# Patient Record
Sex: Female | Born: 1950 | Race: White | Hispanic: No | Smoking: Never smoker
Health system: Southern US, Community
[De-identification: ages and names within clinical notes are randomized; demographics above are authoritative.]

## PROBLEM LIST (undated history)

## (undated) DIAGNOSIS — G20 Parkinson's disease: Secondary | ICD-10-CM

## (undated) DIAGNOSIS — G20A1 Parkinson's disease without dyskinesia, without mention of fluctuations: Secondary | ICD-10-CM

## (undated) DIAGNOSIS — J069 Acute upper respiratory infection, unspecified: Secondary | ICD-10-CM

## (undated) HISTORY — PX: TUBAL LIGATION: SHX77

## (undated) HISTORY — DX: Acute upper respiratory infection, unspecified: J06.9

## (undated) HISTORY — PX: OTHER SURGICAL HISTORY: SHX169

## (undated) HISTORY — PX: THYROIDECTOMY: SHX17

## (undated) HISTORY — PX: GALLBLADDER SURGERY: SHX652

---

## 2019-11-07 DIAGNOSIS — G20 Parkinson's disease: Secondary | ICD-10-CM | POA: Insufficient documentation

## 2020-08-05 DIAGNOSIS — M545 Low back pain, unspecified: Secondary | ICD-10-CM

## 2020-08-05 DIAGNOSIS — G629 Polyneuropathy, unspecified: Secondary | ICD-10-CM | POA: Insufficient documentation

## 2020-08-05 HISTORY — DX: Low back pain, unspecified: M54.50

## 2020-08-05 HISTORY — DX: Polyneuropathy, unspecified: G62.9

## 2021-01-27 DIAGNOSIS — R490 Dysphonia: Secondary | ICD-10-CM | POA: Insufficient documentation

## 2021-01-27 DIAGNOSIS — E049 Nontoxic goiter, unspecified: Secondary | ICD-10-CM | POA: Insufficient documentation

## 2021-01-27 HISTORY — DX: Nontoxic goiter, unspecified: E04.9

## 2021-01-27 HISTORY — DX: Dysphonia: R49.0

## 2021-02-10 DIAGNOSIS — R29898 Other symptoms and signs involving the musculoskeletal system: Secondary | ICD-10-CM

## 2021-02-10 HISTORY — DX: Other symptoms and signs involving the musculoskeletal system: R29.898

## 2021-02-15 HISTORY — PX: THYROIDECTOMY: SHX17

## 2021-05-02 DIAGNOSIS — G4710 Hypersomnia, unspecified: Secondary | ICD-10-CM | POA: Insufficient documentation

## 2021-05-02 DIAGNOSIS — R0602 Shortness of breath: Secondary | ICD-10-CM

## 2021-05-02 HISTORY — DX: Shortness of breath: R06.02

## 2021-05-02 HISTORY — DX: Hypersomnia, unspecified: G47.10

## 2021-05-07 ENCOUNTER — Encounter (HOSPITAL_COMMUNITY): Payer: Self-pay | Admitting: Emergency Medicine

## 2021-05-07 ENCOUNTER — Other Ambulatory Visit: Payer: Self-pay

## 2021-05-07 ENCOUNTER — Emergency Department (HOSPITAL_COMMUNITY): Payer: Medicare Other

## 2021-05-07 ENCOUNTER — Emergency Department (HOSPITAL_COMMUNITY)
Admission: EM | Admit: 2021-05-07 | Discharge: 2021-05-07 | Disposition: A | Discharge status: Home / Self Care | Payer: Medicare Other | Attending: Emergency Medicine | Admitting: Emergency Medicine

## 2021-05-07 DIAGNOSIS — R0602 Shortness of breath: Secondary | ICD-10-CM | POA: Insufficient documentation

## 2021-05-07 DIAGNOSIS — G20 Parkinson's disease: Secondary | ICD-10-CM | POA: Diagnosis not present

## 2021-05-07 DIAGNOSIS — R0600 Dyspnea, unspecified: Secondary | ICD-10-CM

## 2021-05-07 HISTORY — DX: Parkinson's disease: G20

## 2021-05-07 HISTORY — DX: Parkinson's disease without dyskinesia, without mention of fluctuations: G20.A1

## 2021-05-07 LAB — BASIC METABOLIC PANEL
Anion gap: 11 (ref 5–15)
BUN: 20 mg/dL (ref 8–23)
CO2: 26 mmol/L (ref 22–32)
Calcium: 9.3 mg/dL (ref 8.9–10.3)
Chloride: 102 mmol/L (ref 98–111)
Creatinine, Ser: 0.83 mg/dL (ref 0.44–1.00)
GFR, Estimated: >60 mL/min (ref >60)
Glucose, Bld: 117 mg/dL — ABNORMAL HIGH (ref 70–99)
Potassium: 3.8 mmol/L (ref 3.5–5.1)
Sodium: 139 mmol/L (ref 135–145)

## 2021-05-07 LAB — CBC
HCT: 44.2 % (ref 36.0–46.0)
Hemoglobin: 13.6 g/dL (ref 12.0–15.0)
MCH: 30.9 pg (ref 26.0–34.0)
MCHC: 30.8 g/dL (ref 30.0–36.0)
MCV: 100.5 fL — ABNORMAL HIGH (ref 80.0–100.0)
Platelets: 243 10*3/uL (ref 150–400)
RBC: 4.4 MIL/uL (ref 3.87–5.11)
RDW: 13.2 % (ref 11.5–15.5)
WBC: 9.1 10*3/uL (ref 4.0–10.5)
nRBC: 0 % (ref 0.0–0.2)

## 2021-05-07 LAB — URINALYSIS, ROUTINE W REFLEX MICROSCOPIC
Bilirubin Urine: NEGATIVE
Glucose, UA: NEGATIVE mg/dL
Hgb urine dipstick: NEGATIVE
Ketones, ur: NEGATIVE mg/dL
Leukocytes,Ua: NEGATIVE
Nitrite: NEGATIVE
Protein, ur: NEGATIVE mg/dL
Specific Gravity, Urine: 1.008 (ref 1.005–1.030)
pH: 6 (ref 5.0–8.0)

## 2021-05-07 NOTE — ED Notes (Signed)
Pt O2 sats stayed above 90% while ambulating in hallway. Pt did report increased SOB while walking.

## 2021-05-07 NOTE — Discharge Instructions (Addendum)
Your work-up was reassuring today did not provide a clear cause of the shortness of breath.  Follow-up with her pulmonologist here or the outpatient pulmonary as planned.  Continue your home medicines

## 2021-05-07 NOTE — ED Provider Notes (Signed)
[facility] EMERGENCY DEPARTMENT Provider Note   CSN: [id number] Arrival date & time: 05/07/21  0941     History  Chief Complaint  Patient presents with   Shortness of Breath    Jennifer Fletcher is a 71 y.o. female.   Shortness of Breath Associated symptoms: no abdominal pain and no rash   Patient presents with shortness of breath.  Has had episodes since August.  Has been seen at outside hospitals without clear cause.  Negative work-up.  Plans to follow-up with pulmonary but has not seen yet.  Worse episode today.  States she was worried she was going to die.  History comes from patient and her daughter.  Has been seen by neurology.  Has had a thyroid surgery that was done in November and did not change in symptoms either better or worse.  No chest pain.  No swelling in her legs.  History of Parkinson's.  No new medication changes.  Between episodes breathing is back to normal.  Appears to come on with more walking.   Past Medical History:  Diagnosis Date   Parkinson disease ([place])     Home Medications Prior to Admission medications   Not on File      Allergies    Patient has no allergy information on record.    Review of Systems   Review of Systems  Constitutional:  Negative for appetite change.  Respiratory:  Positive for shortness of breath.   Gastrointestinal:  Negative for abdominal pain.  Genitourinary:  Negative for flank pain.  Musculoskeletal:  Negative for gait problem.  Skin:  Negative for rash.  Neurological:  Negative for tremors.  Psychiatric/Behavioral:  The patient is nervous/anxious.    Physical Exam Updated Vital Signs BP 111/86    Pulse 72    Temp (!) 97.4 F (36.3 C)    Resp (!) 21    SpO2 (!) 86%  Physical Exam Vitals and nursing note reviewed.  HENT:     Head: Normocephalic.  Cardiovascular:     Rate and Rhythm: Regular rhythm.  Pulmonary:     Breath sounds: Normal breath sounds. No wheezing, rhonchi or rales.   Musculoskeletal:     Right lower leg: No edema.     Left lower leg: No edema.  Skin:    General: Skin is warm.     Capillary Refill: Capillary refill takes less than 2 seconds.  Neurological:     Mental Status: She is alert and oriented to person, place, and time.    ED Results / Procedures / Treatments   Labs (all labs ordered are listed, but only abnormal results are displayed) Labs Reviewed  BASIC METABOLIC PANEL - Abnormal; Notable for the following components:      Result Value   Glucose, Bld 117 (*)    All other components within normal limits  CBC - Abnormal; Notable for the following components:   MCV 100.5 (*)    All other components within normal limits  URINALYSIS, ROUTINE W REFLEX MICROSCOPIC - Abnormal; Notable for the following components:   Color, Urine STRAW (*)    All other components within normal limits    EKG EKG Interpretation  Date/Time:  Saturday May 07 2021 09:52:22 EST Ventricular Rate:  69 PR Interval:  144 QRS Duration: 84 QT Interval:  396 QTC Calculation: 424 R Axis:   -5 Text Interpretation: Normal sinus rhythm Minimal voltage criteria for LVH, may be normal variant ( R in aVL ) Borderline ECG  No previous ECGs available Confirmed by Davonna Belling [phone]) on 05/07/2021 12:49:46 PM  Radiology DG Chest 2 View  Result Date: 05/07/2021 CLINICAL DATA:  Shortness of breath. EXAM: CHEST - 2 VIEW COMPARISON:  12/22/2020 FINDINGS: The heart size and mediastinal contours are within normal limits. Both lungs are clear. The visualized skeletal structures are unremarkable. IMPRESSION: No active cardiopulmonary disease. Electronically Signed   By: Kerby Moors M.D.   On: 05/07/2021 10:48    Procedures Procedures    Medications Ordered in ED Medications - No data to display  ED Course/ Medical Decision Making/ A&P                           Medical Decision Making Amount and/or Complexity of Data Reviewed External Data Reviewed:  notes. Labs: ordered. Decision-making details documented in ED Course. Radiology: ordered and independent interpretation performed. ECG/medicine tests: independent interpretation performed.  Risk Prescription drug management. Decision regarding hospitalization.   Patient presents with shortness of breath.  Is had for months now.  Has been worked up at outside hospitals by neurology ENT and PCP.  No clear cause has been found.  Has episodes with exertion at times.  When she is not having the episodes her breathing to be normal.  States no real cough.  No swelling in her legs.  Has used inhalers in the past without relief.  Chest x-ray reassuring today and independently interpreted by me.  Not hypoxic with ambulation.  Reviewed previous records from outside hospital and showed previous negative D-dimer and troponins.  This appears to be the same complaints that she has had previously. Unclear cause of dyspnea although do not feel as if she needs admission the hospital at this time.  No pneumonia.  Doubt pulm embolism doubt coronary artery disease.  Does not appear to be CHF.  Potential could be an anxiety component it is already on anxiety medicines.  Has outpatient follow-up with pulmonology but referral in town here given for our pulmonology if she would rather go with them.  Will discharge home.  Discussed with patient and her daughter.        Final Clinical Impression(s) / ED Diagnoses Final diagnoses:  Dyspnea, unspecified type    Rx / DC Orders ED Discharge Orders     None         Davonna Belling, MD 05/07/21 1408

## 2021-05-07 NOTE — ED Notes (Signed)
Patient transported to X-ray 

## 2021-05-07 NOTE — ED Triage Notes (Signed)
Pt reports SOB since September.  States she has been seen multiple times at different hospitals for same and was told it was her Parkinsons.  She has seen her neurologist and ENT and continues to have SOB.    Also reports generalized weakness over the past few days.

## 2021-05-11 NOTE — Progress Notes (Signed)
Assessment/Plan:   1.  Parkinson's disease, diagnosed 2014  -Patient currently on the extended release version of the medication.  She takes 800 mg of levodopa throughout the course of the day.  Discussed with the patient that we can return to the immediate release version of levodopa.  This is generally more effective with a higher absorption rate, but if we did that , discussed with her that she would likely have more dyskinesia.  Ultimately, she decided to trial that.  This is partly because she is having what sounds like panic attacks, and she is not sure if that is because of wearing off phenomenon.  For now, she is going to stop the carbidopa/levodopa 50/200 CR, 1 tablet that she is taking at 4 AM/8 AM/noon/4 PM  -She will start carbidopa/levodopa 25/100, 2 tablets at 4 AM (she gets out of bed at this time), and take 4-6 additional single tablets throughout the day, with the last being at 4 PM (she goes to bed at 7 PM)  -For now, she will continue pramipexole 0.5 mg 3 times per day.  I do plan to wean that in the near future because of illusions and hallucinations.  -Discussed extensively the importance of safe, cardiovascular exercise.  Discussed community resources.  -We will refer to [facility].  -She met with my LCSW today.  -Discussed amantadine.  We decided to hold on that for right now.  -I talked to the patient about the logistics associated with DBS therapy.  I talked to the patient about risks/benefits/side effects of DBS therapy.  We talked about risks which included but were not limited to infection, paralysis, intraoperative seizure, death, stroke, bleeding around the electrode.   I talked to patient about fiducial placement 1 week prior to DBS therapy.  I talked to the patient about what to expect in the operating room, including the fact that this is an awake surgery.  We talked about battery placement as well as which is done under general anesthesia, generally  approximately one week following the initial surgery.  We also talked about the fact that the patient will need to be off of medications for surgery.  The patient and family were given the opportunity to ask questions, which they did, and I answered them to the best of my ability today.  She is going to talk to her family further about this.  We did discuss work-up related to this including neurocognitive testing and levodopa challenge test.  2.  Patient was given the choice of following up here with her primary neurologist, who really has done a good job with her.  Patient is currently living with daughter in [place], so they decided to follow-up here for now.  I will plan on seeing the patient back in the next 3 months, sooner should new neurologic issues arise.  Subjective:   Jennifer Fletcher was seen today in the movement disorders clinic for neurologic consultation at the request of Nevin Bloodgood,*.  The consultation is for the evaluation of Parkinson's disease.  Pt with daughter who supplements the history.  The patient has also been seen by Dr. Buck Mam at [facility] movement disorders as well as Dr Carmelina Paddock.  Records from both of those physicians have been reviewed.  She saw Dr. Buck Mam in July, 2021.  This appears to be a one-time consult.  She noted that patient had mild parkinsonism, moderate dyskinesia of the feet.  She returned to her primary neurologist, Dr.  Chintalapudi in April, 2022.  At that point, her pramipexole was increased from 0.5 mg at bedtime to 0.5 mg 3 times per day.  Symptoms for started in 2013 with difficulty ambulating.  She was started on carbidopa levodopa at that time.  Current movement disorder medications: Carbidopa/levodopa 50/200 CR, 1 tablet 4 times per day at 4am/8am/noon/4pm Pramipexole 0.5 mg at bedtime at 4am/noon/6pm Gabapentin, 300 mg 3 times daily  Specific Symptoms:  Tremor: "a little bit" but never a prominent feature (? In  the R hand) Family hx of similar:  no first degree relatives with Parkinsons Disease.  P uncle with PD Voice: voice weaker Sleep: trouble staying asleep  Vivid Dreams:  better lately  Acting out dreams:  some screaming but not acting out with falling Wet Pillows: Yes.   Postural symptoms:  Yes.    Falls?  Last fall was 2-3 years ago but has near falls Bradykinesia symptoms: shuffling gait, slow movements, and difficulty getting out of a chair Loss of smell:  No. Loss of taste:  No. Urinary Incontinence:  No. Difficulty Swallowing:  Yes.  (Some with pills) Handwriting, micrographia: Yes.   Per daughter but pt not so sure Trouble with ADL's:  No.  Trouble buttoning clothing: unsure Depression:  No. But admits to anxiety Memory changes:  may lose some train of thought in middle of sentence; stays with daughter right now (just for last week or so) - prior to that other daughter living with her temporarily Hallucinations:  Yes.  , sees people, 5 out of 7 days, knows not real (really more illusions than hallucinations) N/V:  No. Lightheaded:  rare  Syncope: No. Diplopia:  No. Dyskinesia:  Yes.  , started around 2019 (stress makes worse)  Neuroimaging of the brain has previously been performed.  It is not available for my review today.  Prior meds:  carbidopa/levodopa 50/200; pramipexole;     ALLERGIES:   Allergies  Allergen Reactions   Codeine Nausea And Vomiting    CURRENT MEDICATIONS:  Current Meds  Medication Sig   aspirin 81 MG chewable tablet Chew by mouth.   carbidopa-levodopa (SINEMET CR) 50-200 MG tablet Take 1 tablet by mouth 4 (four) times daily.   cetirizine (ZYRTEC) 10 MG tablet Take by mouth.   clonazePAM (KLONOPIN) 0.5 MG tablet Take by mouth.   diclofenac (VOLTAREN) 75 MG EC tablet Take 75 mg by mouth 2 (two) times daily.   gabapentin (NEURONTIN) 300 MG capsule Take by mouth.   levothyroxine (SYNTHROID) 112 MCG tablet    mupirocin ointment (BACTROBAN) 2 %     pramipexole (MIRAPEX) 0.5 MG tablet Take by mouth.   sertraline (ZOLOFT) 25 MG tablet Take by mouth.   tiZANidine (ZANAFLEX) 4 MG tablet Take by mouth.     Objective:   VITALS:   Vitals:   05/13/21 0844  BP: 135/63  Pulse: 62  SpO2: 94%  Weight: 206 lb 9.6 oz (93.7 kg)  Height: 5' 7" (1.702 m)    GEN:  The patient appears stated age and is in NAD. HEENT:  Normocephalic, atraumatic.  The mucous membranes are moist. The superficial temporal arteries are without ropiness or tenderness. CV:  RRR Lungs:  CTAB Neck/HEME:  There are no carotid bruits bilaterally.  Neurological examination:  Orientation: The patient is alert and oriented x3.  Cranial nerves: There is good facial symmetry. There is facial hypomimia.  Extraocular muscles are intact. The visual fields are full to confrontational testing. The speech is fluent and  clear. Soft palate rises symmetrically and there is no tongue deviation. Hearing is intact to conversational tone. Sensation: Sensation is intact to light and pinprick throughout (facial, trunk, extremities). Vibration is intact at the bilateral big toe. There is no extinction with double simultaneous stimulation. There is no sensory dermatomal level identified. Motor: Strength is 5/5 in the bilateral upper and lower extremities.   Shoulder shrug is equal and symmetric.  There is no pronator drift. Deep tendon reflexes: Deep tendon reflexes are 1/4 at the bilateral biceps, triceps, brachioradialis, patella and achilles. Plantar responses are downgoing bilaterally.  Movement examination: Tone: There is nl tone in the bilateral upper extremities.  The tone in the lower extremities is nl.  Abnormal movements: there is mod axial and LE dyskinesia (took about 45 min ago) Coordination:  There is min decremation with RAM's, with any form of RAMS, including alternating supination and pronation of the forearm, hand opening and closing, finger taps, heel taps and toe taps  bilaterally Gait and Station: The patient pushes off to arise.  The patient's stride length is decreased but does not shuffle.  Decreased arm swing on the right. .   I have reviewed and interpreted the following labs independently   Chemistry      Component Value Date/Time   NA 139 05/07/2021 0956   K 3.8 05/07/2021 0956   CL 102 05/07/2021 0956   CO2 26 05/07/2021 0956   BUN 20 05/07/2021 0956   CREATININE 0.83 05/07/2021 0956      Component Value Date/Time   CALCIUM 9.3 05/07/2021 0956      No results found for: TSH Lab Results  Component Value Date   WBC 9.1 05/07/2021   HGB 13.6 05/07/2021   HCT 44.2 05/07/2021   MCV 100.5 (H) 05/07/2021   PLT 243 05/07/2021     Total time spent on today's visit was 60 minutes, including both face-to-face time and nonface-to-face time.  Time included that spent on review of records (prior notes available to me/labs/imaging if pertinent), discussing treatment and goals, answering patient's questions and coordinating care.  Cc:  Theodosia Blender, PA-C

## 2021-05-11 NOTE — Progress Notes (Signed)
Synopsis: Referred for episodic dyspnea by Oval Linsey, PA-C  Subjective:   PATIENT ID: Jennifer Fletcher GENDER: female DOB: 03/13/1951, MRN: [id number]  Chief Complaint  Patient presents with   Consult    Pt has been having problems with SOB, states that she will have a panic attack which will make things worse.   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 Has been worked up by OSH neurology - has been scheduled for PFT and PSG given concomitant excessive daytime sleepiness and parasomnias, ENT - referred to speech for left vocal cord paresis.  Her episodic dyspnea comes on abruptly periodically through the day. She says she gets dyspneic then anxious 3x daily. Takes a few hours to calm down. Pulse ox usually normal during the episodes. She doesn't feel associated throat tightness. She does sometimes have associated throat tightness. This has been an issue even before thyroidectomy. Has been going on maybe over last year or so. Exertion may also be a trigger for these attacks. She has no orthopnea. No BLE swelling. She has never had a blood clot. She snores. Does not have PND. She does probably have excessive daytime sleepiness.   The only inhaler she has tried has been albuterol  Otherwise pertinent review of systems is negative.  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 No breathing trouble when she worked there. Smoked very little maybe half a pack total in her lifetime. No pets. She has never lived outside of [place].   Past Medical History:  Diagnosis Date   Parkinson disease ([facility])      No family history on file.   Past Surgical History:  Procedure Laterality Date   THYROIDECTOMY      Social History   Socioeconomic History   Marital status: Divorced    Spouse name: Not on file   Number of  children: Not on file   Years of education: Not on file   Highest education level: Not on file  Occupational History   Not on file  Tobacco Use   Smoking status: Never   Smokeless tobacco: Never  Substance and Sexual Activity   Alcohol use: Not Currently   Drug use: Not Currently   Sexual activity: Not on file  Other Topics Concern   Not on file  Social History Narrative   Not on file   Social Determinants of Health   Financial Resource Strain: Not on file  Food Insecurity: Not on file  Transportation Needs: Not on file  Physical Activity: Not on file  Stress: Not on file  Social Connections: Not on file  Intimate Partner Violence: Not on file     Not on File   Outpatient Medications Prior to Visit  Medication Sig Dispense Refill   aspirin 81 MG chewable tablet Chew by mouth.     carbidopa-levodopa (SINEMET CR) 50-200 MG tablet Take 1 tablet by mouth 4 (four) times daily.     cetirizine (ZYRTEC) 10 MG tablet Take by mouth.     clonazePAM (KLONOPIN) 0.5 MG tablet Take by mouth.     diclofenac (VOLTAREN) 75 MG EC tablet Take 75 mg by mouth 2 (two) times daily.     gabapentin (NEURONTIN) 300 MG capsule Take by mouth.     levothyroxine (SYNTHROID) 112 MCG tablet  mupirocin ointment (BACTROBAN) 2 %      pramipexole (MIRAPEX) 0.5 MG tablet Take by mouth.     sertraline (ZOLOFT) 25 MG tablet Take by mouth.     tiZANidine (ZANAFLEX) 4 MG tablet Take by mouth.     No facility-administered medications prior to visit.       Objective:   Physical Exam:  General appearance: 71 y.o., female, NAD, conversant  Eyes: anicteric sclerae; PERRL, tracking appropriately HENT: NCAT; MMM Neck: Trachea midline; no lymphadenopathy, no JVD Lungs: CTAB, no crackles, no wheeze, with normal respiratory effort CV: RRR, no murmur  Abdomen: Soft, non-tender; non-distended, BS present  Extremities: No peripheral edema, warm Skin: Normal turgor and texture; no rash Psych: Appropriate  affect Neuro: Alert and oriented to person and place, no focal deficit     Vitals:   05/12/21 0945  BP: 118/70  Pulse: 64  Temp: 97.8 F (36.6 C)  TempSrc: Oral  SpO2: 97%  Weight: 208 lb 6.4 oz (94.5 kg)  Height: 5\' 7"  (1.702 m)   97% on RA BMI Readings from Last 3 Encounters:  05/12/21 32.64 kg/m   Wt Readings from Last 3 Encounters:  05/12/21 208 lb 6.4 oz (94.5 kg)     CBC    Component Value Date/Time   WBC 9.1 05/07/2021 0956   RBC 4.40 05/07/2021 0956   HGB 13.6 05/07/2021 0956   HCT 44.2 05/07/2021 0956   PLT 243 05/07/2021 0956   MCV 100.5 (H) 05/07/2021 0956   MCH 30.9 05/07/2021 0956   MCHC 30.8 05/07/2021 0956   RDW 13.2 05/07/2021 0956     Chest Imaging: CXR 05/07/21 reviewed by me unremarkable  Pulmonary Functions Testing Results: No flowsheet data found.      Assessment & Plan:   # Episodic dyspnea: Unclear etiology. Never did have asthma and resolution between episodes doesn't sound typical for asthma. Anxiety component, prior goiter, thyroidectomy makes me wonder about vocal cord issue whether VCD +/- lingering vocal cord paralysis, irritable larynx/LPR/gerd. Would be an odd way for PD-related autonomic instability to manifest and doesn't have associated heart racing or palpitations/lightheadedness. Would expect any other PD -related respiratory issue to be more of a sleep-disordered breathing problem. Angina is maybe a less likely possibility, has never had workup in this regard.  Plan: - PFT in 2-4 weeks, if unrevealing then would probably recommend cardiology referral, consideration of stress testing - PSG as already scheduled with OSH    RTC 2-4 weeks to review PFTs   05/09/21, MD [facility] 05/12/2021 9:51 AM

## 2021-05-12 ENCOUNTER — Ambulatory Visit (INDEPENDENT_AMBULATORY_CARE_PROVIDER_SITE_OTHER): Payer: Medicare Other | Admitting: Student

## 2021-05-12 ENCOUNTER — Other Ambulatory Visit: Payer: Self-pay

## 2021-05-12 ENCOUNTER — Encounter: Payer: Self-pay | Admitting: Student

## 2021-05-12 VITALS — BP 118/70 | HR 64 | Temp 97.8°F | Ht 67.0 in | Wt 208.4 lb

## 2021-05-12 DIAGNOSIS — R0600 Dyspnea, unspecified: Secondary | ICD-10-CM

## 2021-05-12 NOTE — Patient Instructions (Signed)
-   PFT (breathing test) in 2-4 weeks with clinic appointment hopefully on same day - would still get sleep study done as scheduled already

## 2021-05-13 ENCOUNTER — Ambulatory Visit (INDEPENDENT_AMBULATORY_CARE_PROVIDER_SITE_OTHER): Payer: Medicare Other | Admitting: Neurology

## 2021-05-13 ENCOUNTER — Encounter: Payer: Self-pay | Admitting: Neurology

## 2021-05-13 ENCOUNTER — Other Ambulatory Visit: Payer: Self-pay

## 2021-05-13 VITALS — BP 135/63 | HR 62 | Ht 67.0 in | Wt 206.6 lb

## 2021-05-13 DIAGNOSIS — G249 Dystonia, unspecified: Secondary | ICD-10-CM

## 2021-05-13 DIAGNOSIS — G20 Parkinson's disease: Secondary | ICD-10-CM

## 2021-05-13 DIAGNOSIS — R413 Other amnesia: Secondary | ICD-10-CM

## 2021-05-13 MED ORDER — CARBIDOPA-LEVODOPA 25-100 MG PO TABS: ORAL_TABLET | ORAL | 1 refills | Status: DC

## 2021-05-13 NOTE — Patient Instructions (Addendum)
Deep Brain Stimulation  Is it the right choice for me?   What is Deep Brain Stimulation (DBS) Surgery?  DBS is a surgical procedure used to treat symptoms of Parkinsons disease (PD). It involves the implantation of an electrode into the brain (one on each side). The area of the brain that is typically targeted in PD is the subthalamic nucleus.   How does DBS work?  PD is caused by the degeneration of brain cells in a specific part of the brain which make a chemical (neurotransmitter) called dopamine. As time goes by, more and more cells degenerate and the level of dopamine in the brain declines. As a result of this dopamine deficiency, there is a certain circuit in the brain which becomes abnormally overactive. Many symptoms of PD are due to this abnormal, overactive circuit. With DBS, high frequency electrical stimulation is used to disrupt this circuit, thereby blocking the symptoms of PD that were previously being mediated through that circuit. The three main symptoms of PD are shaking (tremor), slowness of movement (bradykinesia), and stiffness (rigidity). All of these symptoms are mediated through this small circuit. Therefore, DBS is very effective in blocking these symptoms. It is important to remember, however, that DBS does not cure PD, but rather is a very effective method of treating the symptoms of the disease.   What is actually done during the operation?  The surgical procedure involves the implantation of 2 electrodes (one on each side of the brain). The electrodes are connected to 2 wires, which are then connected to a generator- pacemaker like device (either one or two) in the chest. The generator (and wires) are placed under the skin similar to a cardiac pacemaker, thus the device itself is not visible. The implanted hardware does, however, produce a lump on the chest where the generator is placed and two small bumps on the scalp where underneath there are small plastic caps which  are screwed into the skull and secure the electrode.   What symptoms can I expect DBS to treat?  DBS treats many, but not all symptoms of PD. As mentioned above, tremor, stiffness (rigidity), and slowness of movement (bradykinesia) all respond well to DBS therapy. In addition, many patients with advanced PD have problems with what we call motor fluctuations. This refers to the wearing off of medication before the next dose associated with breakthrough of PD symptoms, and at other times the effects of excess medication, such as involuntary wiggling (dyskinesia). Because the electrical stimulation is constant, the effect is continuous. Therefore motor fluctuations can be significantly reduced. Furthermore, after DBS most patients are able to significantly reduce the amount of Parkinsons medications they were previously taking. Therefore, side effects of these medications can be significantly reduced as well, and often completely eliminated. Common anti-Parkinson medication side effects include: involuntary wiggling (dyskinesia), visual distortions and hallucinations, nausea and vomiting, and lightheadedness.   What symptoms are not treated with DBS?  Some symptoms of PD are mediated through other brain circuits. Therefore, those symptoms would not be expected to improve with DBS. These symptoms include: soft and mumbled speech (hypophonia), balance trouble, and memory deficits. Even if the DBS surgery is done perfectly, the patient will still have PD. Therefore, because DBS does not block all of the effected brain circuits, the above mentioned symptoms will likely continue to progress and worsen with time.   How functional can I expect to be following DBS surgery?  Most patients who are good candidates improve with DBS. Think  about how functional you are now, when your medicines are kicked in and working at their very best. After DBS we can often get you to that point and keep you there, without all of  the fluctuations and the medication side effects. Some patients with PD have bad tremor that does not respond well to medication. DBS can work well to control tremor even when medication cannot.   What are the risks of surgery?  Because the surgery involves introducing a foreign object into the brain, there are inherent risks that are present. First, there is a very small risk, approximately 1%, of having bleeding into the brain causing symptoms similar to that of a stroke. Secondly, there is a 5-7% chance of having an infection related to the procedure. If the device gets infected, then the treatment usually requires that the infected hardware be removed temporarily while antibiotics are given. After the infection is resolved, then the hardware needs to be re-implanted. This would not leave the patient with permanent problems, but it is easy to understand how disappointed someone might be if they have to go through the surgery again. Typical symptoms of infection include redness, swelling, or pain around the device on the skin. There is theoretically a very small chance (much less than 1%) that an infection could spread to the brain. This, of course, would be much more serious. Another small risk of brain surgery is possible seizure (2-3%). A seizure produces transient sudden loss of consciousness and generalized shaking (convulsion). This can be caused by irritation of the brain during the operation. If a seizure occurs, it is almost always at the time of operation. It may require temporarily being treated with seizure medications, but this is typically only short term.   How much trouble is it to get DBS?  Unfortunately, undergoing DBS surgery is a process involving multiple steps. Even prior to surgery, there are several steps that must be done. The surgery itself takes place in three separate parts. About a week prior to insertion of the electrodes, you will be seen in an ambulatory surgery center to put  in markers into the skull, called fiducials. This allows [place] to plan the surgery and to better localize the area in which we will operate. One week later, stage 1 of the procedure will be done in which the electrodes are implanted. Approximately one week after this, stage 2 of the surgery will be done in which the generator (battery) is inserted. Stage 1 of the surgery usually takes several hours. This is when the electrode is placed. This surgery has to be done while the patient is awake. Local anesthesia is used, so the procedure is not generally very painful, but obviously it is a little scary to be operated on while you are awake. Furthermore, patients need to be off of their anti-Parkinson medication during the operation, so that we can more easily identify the abnormal circuit in the brain. It is unpleasant being off anti-Parkinson medication, even for this short time. Approximately 6 weeks after the electrode placement, programming of the device will take place at our first in office clinic visit. This allows [place] to alter the type of stimulation and optimize the beneficial effects. This is done over several clinic visits. The second post op clinic visit is usually just a week after the first, but subsequent visits will be less frequent. Eventually you shouldnt need to be seen more than once every 4-6 months or so. Overall, you should expect several programming  visits before you see the full benefit from DBS surgery.   How long does the hardware last?  The generator runs on a battery inside the device. How long the battery lasts depends on the manufacturer of the device and whether you choose are rechargeable device or a traditional battery.  A traditional battery may last 3 to 5 years and a rechargeable device may last 15-20 years. The battery replacement operation, however, is much easier than the initial elaborate operation. It is typically done as an outpatient procedure.   Does DBS always work?  The  key to success is exact placement of the electrode. As you can imagine, the brain has many circuits which are closely packed together. If the electrode is close to being in the right position, but not quite, then there may be a partial response rather than a complete response. If this happens, we may have to turn up the stimulation to try and more completely block the circuit. If we do this, however, we may effect other adjacent circuits that we are not intending to effect, and thereby produce stimulation-related side effects such as slurred speech or facial muscle pulling. These side effects can be easily eliminated by reducing the strength of the stimulation, but then some of the Parkinson symptoms may break through.   Is DBS the right choice for you?  As you can now see, there are many things that carefully need to be considered when making this decision. DBS is not appropriate for all patients with PD. The ultimate decision is yours to make. It is our job to provide you with all the pros and cons, so that you can make the choice that is right for you.  Logistical Details: Pre-Operative Visits:  1) On-Off Testing. This visit takes place in the clinic with Dr. Carles Collet several weeks prior to surgery to help determine if you are a good DBS candidate. You will come to the clinic having NOT TAKEN your PD medications. A series of physical examination tests will be done. Then you will be given a dose of carbidopa/levodopa dissolved in ginger ale. Approximately 30 minutes later you will be re-examined with the same tests to see how you respond.   IT IS EXTREMELY IMPORTANT NOT TO TAKE YOUR PARKINSON MEDICATIONS ON THE DAY OF THIS CLINIC VISIT.   2) Neuropsychological Testing. This is standard testing in all potential candidates to help determine those patients that may be at risk for developing worsening cognition from the procedure. This is a long clinic visit (multiple hours).  3) Pre-Operative MRI. If you are  deemed to be a good surgical candidate based on the above 2 visits, you will need to have MRI imaging done. It is very important that we get high quality study. You must have someone accompany you to this visit as we may have to give you sedation in order to make sure the MRI images are of adequate quality.  What to expect regarding surgery:  1. The first step involves placement of the fiducial (reference) pins. This is done the week before surgery by Dr. Vertell Limber. You are given 4 local injections of anesthetic (numbing medication). Next, 4 pins are screwed into the skull. Following the placement of the pins, you will be transferred down to have a head CT scan. The CT is used in planning for the surgery.  2. Surgery typically takes place one week later. You will have been off all of your Parkinson medications. This can be quite uncomfortable! 3.  You will have the sense of hurry up and wait multiple times throughout the day, but it is extremely important to remain as patient as possible. It is during these times that we are busy behind the scenes doing the surgical planning. 4. In the pre-op area, you will meet with the nurses and anesthesia staff. You may have a catheter placed into the bladder. Once you are taken back to the OR suite, you will be placed in a beach chair position. You will not be under general anesthesia. We need you to be awake during certain parts to allow [place] to do important testing. The actual surgical procedure is not generally painful. It is done with local anesthetic agents. However, the procedure can take many hours, and it is expected that youll become uncomfortable. We try to minimize any sedating medications, but can give you something if needed.  5. You will have a bad haircut, but it will grow back!  6. Following the surgery, you will stay overnight in the hospital for observation.  7. The following day, you will have either an MRI brain or a CT brain to allow [place] to evaluate  the placement of the electrodes and also to make sure there were no bleeding complications. Provided there are no complications, you will be discharged home the day after surgery.   It is extremely important to remember that after having DBS surgery, you will need to notify your physicians before you have an MRI.  Most DBS electrodes ARE now MRI compatible but the DBS must be placed in a special mode before the MRI is completed for your safety.  8. About 1 week later you will return for an outpatient surgery that lasts 1-2 hours during which the generator(s) will be placed. You will go home on the same day as the surgery. You will find that you are more uncomfortable after this surgery than your first surgery. You will be given medications to help with this. The pain from this surgery usually resolves in 2 or 3 days.   Start carbidopa levodopa 25/100 IR 2 tablets in the morning and 4-6 single tablets throughout the day. Last pill at 4pm  If you begin to feel panic you may dissolve in ginger ale or chew 1/2 pill of carbidopa levodopa 25/100 IR  Physical Therapy referral has been put in for you to [facility] in [place], [place] [address] Phone: [phone] Fax: [phone]

## 2021-05-15 ENCOUNTER — Encounter: Payer: Self-pay | Admitting: Neurology

## 2021-05-16 ENCOUNTER — Encounter: Payer: Self-pay | Admitting: Neurology

## 2021-06-06 ENCOUNTER — Telehealth: Payer: Self-pay | Admitting: Neurology

## 2021-06-06 ENCOUNTER — Other Ambulatory Visit: Payer: Self-pay

## 2021-06-06 DIAGNOSIS — G20 Parkinson's disease: Secondary | ICD-10-CM

## 2021-06-06 MED ORDER — CARBIDOPA-LEVODOPA ER 50-200 MG PO TBCR: 1.0000 | EXTENDED_RELEASE_TABLET | Freq: Every day | ORAL | 0 refills | Status: DC

## 2021-06-06 NOTE — Telephone Encounter (Signed)
Called patients daughter back and left voicemail message  ?

## 2021-06-06 NOTE — Telephone Encounter (Signed)
Patients daughter called me back and let me know that patient is doing great during the day with her current dosage of carbidopa levodopa and she said it has been a god send for her mom. The problem they are having now is in the evenings. Patient is getting frozen and is depended on help from a care giver at nights. Patients daughter looking for a night time medication or change in medication to help her at night. Patient currently doing at home health care for PT and when that expires they will start the pt at an outside source

## 2021-06-06 NOTE — Telephone Encounter (Signed)
Patients daughter called and stated she had a couple questions.  She stated that Jennifer Fletcher completed a sleep study thru [facility].  She wants to know if she need to get a records request.  She also stated that Jennifer Fletcher is on Sinemet and they like it.  She wants to know if she has to stop taking it at 4 or if she can take more does after 4.

## 2021-06-06 NOTE — Telephone Encounter (Signed)
Call patients daughter she is checking to see if they have any medication carbidopa levodopa 50/200 at home she will call me back to see if I need to send in any for her

## 2021-06-13 NOTE — Progress Notes (Signed)
? ?Synopsis: Referred for episodic dyspnea by Oval Linsey, PA-C ? ?Subjective:  ? ?PATIENT ID: Jennifer Fletcher GENDER: female DOB: [birth year], MRN: [id number] ? ?Chief Complaint  ?Patient presents with  ? Follow-up  ?  PFT's done today. Her breathing is unchanged since her last visit. No new co's.   ? 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 Has been worked up by OSH neurology - has been scheduled for PFT and PSG given concomitant excessive daytime sleepiness and parasomnias, ENT - referred to speech for left vocal cord paresis. ? ?Her episodic dyspnea comes on abruptly periodically through the day. She says she gets dyspneic then anxious 3x daily. Takes a few hours to calm down. Pulse ox usually normal during the episodes. She doesn't feel associated throat tightness. She does sometimes have associated throat tightness. This has been an issue even before thyroidectomy. Has been going on maybe over last year or so. Exertion may also be a trigger for these attacks. She has no orthopnea. No BLE swelling. She has never had a blood clot. She snores. Does not have PND. She does probably have excessive daytime sleepiness.  ? ?The only inhaler she has tried has been albuterol ? ?Father had COPD, lung cancer ? 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 No breathing trouble when she worked there. Smoked very little maybe half a pack total in her lifetime. No pets. She has never lived outside of [place].  ? ?Interval HPI: ? ?Last seen 05/12/21. Had PFT 05/23/21 and PSG 2/15. ? ?Still having bouts of dyspnea and anxiety during the day. She doesn't necessarily remember having throat tightness with these spells. She has no cough. No CP.  ? ?Otherwise pertinent review of systems is negative. ? ?Past Medical History:  ?Diagnosis Date  ? Parkinson disease (HCC)   ?  ? ?Family History   ?Problem Relation Age of Onset  ? High blood pressure Mother   ? COPD Father   ? Lung cancer Father   ?  ? ?Past Surgical History:  ?Procedure Laterality Date  ? galbladder removal    ? GALLBLADDER SURGERY    ? THYROIDECTOMY    ? TUBAL LIGATION    ? ? ?Social History  ? ?Socioeconomic History  ? Marital status: Divorced  ?  Spouse name: Not on file  ? Number of children: Not on file  ? Years of education: Not on file  ? Highest education level: Not on file  ?Occupational History  ? Not on file  ?Tobacco Use  ? Smoking status: Never  ? Smokeless tobacco: Never  ?Vaping Use  ? Vaping Use: Never used  ?Substance and Sexual Activity  ? Alcohol use: Yes  ?  Comment: very rarely  ? Drug use: Not Currently  ? Sexual activity: Not on file  ?Other Topics Concern  ? Not on file  ?Social History Narrative  ? Left handed   ? Lives with family  ? One story home   ? ?Social Determinants of Health  ? ?Financial Resource Strain: Not on file  ?Food Insecurity: Not on file  ?Transportation Needs: Not on file  ?Physical Activity: Not on file  ?Stress: Not on file  ?Social Connections: Not on file  ?Intimate Partner Violence: Not on file  ?  ? ?Allergies  ?Allergen Reactions  ? Codeine Nausea And Vomiting  ?  ? ?  Outpatient Medications Prior to Visit  ?Medication Sig Dispense Refill  ? aspirin 81 MG chewable tablet Chew by mouth.    ? carbidopa-levodopa (SINEMET CR) 50-200 MG tablet Take 1 tablet by mouth at bedtime. 90 tablet 0  ? carbidopa-levodopa (SINEMET IR) 25-100 MG tablet Take 2 tablets in AM 4-6 single tablets throughout the day with last pill no later than 4 pm 720 tablet 1  ? cetirizine (ZYRTEC) 10 MG tablet Take by mouth.    ? clonazePAM (KLONOPIN) 0.5 MG tablet Take by mouth daily.    ? diclofenac (VOLTAREN) 75 MG EC tablet Take 75 mg by mouth 2 (two) times daily.    ? gabapentin (NEURONTIN) 300 MG capsule Take by mouth 3 (three) times daily.    ? levothyroxine (SYNTHROID) 112 MCG tablet     ? mupirocin ointment  (BACTROBAN) 2 %     ? pramipexole (MIRAPEX) 0.5 MG tablet Take by mouth.    ? sertraline (ZOLOFT) 25 MG tablet Take by mouth.    ? tiZANidine (ZANAFLEX) 4 MG tablet Take by mouth.    ? ?No facility-administered medications prior to visit.  ? ? ? ? ? ?Objective:  ? ?Physical Exam: ? ?General appearance: 71 y.o., female, NAD, conversant  ?Eyes: anicteric sclerae; PERRL, tracking appropriately ?HENT: NCAT; MMM ?Neck: Trachea midline; no lymphadenopathy, no JVD ?Lungs: CTAB, no crackles, no wheeze, with normal respiratory effort ?CV: RRR, no murmur  ?Abdomen: Soft, non-tender; non-distended, BS present  ?Extremities: No peripheral edema, warm ?Skin: Normal turgor and texture; no rash ?Psych: Appropriate affect ?Neuro: Alert and oriented to person and place, no focal deficit  ? ? ? ? ?Vitals:  ? 06/15/21 1002  ?BP: 130/76  ?Pulse: 61  ?Temp: 97.8 ?F (36.6 ?C)  ?TempSrc: Oral  ?SpO2: 97%  ?Weight: 211 lb (95.7 kg)  ?Height: 5\' 7"  (1.702 m)  ? ? ?97% on RA ?BMI Readings from Last 3 Encounters:  ?06/15/21 33.05 kg/m?  ?05/13/21 32.36 kg/m?  ?05/12/21 32.64 kg/m?  ? ?Wt Readings from Last 3 Encounters:  ?06/15/21 211 lb (95.7 kg)  ?05/13/21 206 lb 9.6 oz (93.7 kg)  ?05/12/21 208 lb 6.4 oz (94.5 kg)  ? ? ? ?CBC ?   ?Component Value Date/Time  ? WBC 9.1 05/07/2021 0956  ? RBC 4.40 05/07/2021 0956  ? HGB 13.6 05/07/2021 0956  ? HCT 44.2 05/07/2021 0956  ? PLT 243 05/07/2021 0956  ? MCV 100.5 (H) 05/07/2021 0956  ? MCH 30.9 05/07/2021 0956  ? MCHC 30.8 05/07/2021 0956  ? RDW 13.2 05/07/2021 0956  ? ? ? ?Chest Imaging: ?CXR 05/07/21 reviewed by me unremarkable ? ?Pulmonary Functions Testing Results: ?PFT Results Latest Ref Rng & Units 06/15/2021  ?FVC-Pre L 2.95  ?FVC-Predicted Pre % 87  ?FVC-Post L 3.05  ?FVC-Predicted Post % 90  ?Pre FEV1/FVC % % 80  ?Post FEV1/FCV % % 82  ?FEV1-Pre L 2.37  ?FEV1-Predicted Pre % 92  ?FEV1-Post L 2.51  ?DLCO uncorrected ml/min/mmHg 21.91  ?DLCO UNC% % 101  ?DLCO corrected ml/min/mmHg 21.91  ?DLCO  COR %Predicted % 101  ?DLVA Predicted % 121  ?TLC L 4.88  ?TLC % Predicted % 88  ?RV % Predicted % 72  ?Reviewed by me normal ? ?PSG 06/02/21: ?CLINICAL INTERPRETATION: During the study night patient fell asleep in  ?about 4.6 minutes. This polysomnogram demonstrates minimal sleep  ?disordered breathing, which falls substantially short of the definition of  ?obstructive sleep apnea.  There was no evidence of significant respiratory  ?  events, arousals, desats or periodic limb movement. Mild sleep  ?fragmentation was observed during the study night.  Minimum O2 levels  ?dropped to as low as 90%.  Mild snoring was noticed during most part of  ?the study.  ?   ?   ?Assessment & Plan:  ? ?# Episodic dyspnea: ?Unclear etiology - could just be due to anxiety. Never did have asthma and resolution between episodes doesn't sound typical for asthma, PFTs today normal. Anxiety component, prior goiter, thyroidectomy makes me wonder about vocal cord issue whether VCD +/- lingering vocal cord paralysis, irritable larynx/LPR/gerd - PFTs today however did not show any significant FV loop abnormality and none of these issues have been documented on prior FNL exams with ENT. Would be an odd way for PD-related autonomic instability to manifest and doesn't have associated heart racing or palpitations/lightheadedness. Would expect any other PD -related respiratory issue to be more of a sleep-disordered breathing problem but PSG normal. Angina is maybe a less likely possibility, has never had workup in this regard. ? ?Plan: ?- cardiology referral placed today for consideration of stress testing ? ? ?RTC 4 months ? ? ?Omar Person, MD ? Pulmonary Critical Care ?06/15/2021 10:13 AM  ? ?

## 2021-06-15 ENCOUNTER — Encounter: Payer: Self-pay | Admitting: Student

## 2021-06-15 ENCOUNTER — Ambulatory Visit (INDEPENDENT_AMBULATORY_CARE_PROVIDER_SITE_OTHER): Payer: Medicare Other | Admitting: Student

## 2021-06-15 ENCOUNTER — Other Ambulatory Visit: Payer: Self-pay

## 2021-06-15 ENCOUNTER — Telehealth: Payer: Self-pay | Admitting: Neurology

## 2021-06-15 VITALS — BP 130/76 | HR 61 | Temp 97.8°F | Ht 67.0 in | Wt 211.0 lb

## 2021-06-15 DIAGNOSIS — R0600 Dyspnea, unspecified: Secondary | ICD-10-CM | POA: Diagnosis not present

## 2021-06-15 DIAGNOSIS — G20C Parkinsonism, unspecified: Secondary | ICD-10-CM

## 2021-06-15 DIAGNOSIS — G20 Parkinson's disease: Secondary | ICD-10-CM

## 2021-06-15 LAB — PULMONARY FUNCTION TEST
DL/VA % pred: 121 %
DL/VA: 4.91 ml/min/mmHg/L
DLCO cor % pred: 101 %
DLCO cor: 21.91 ml/min/mmHg
DLCO unc % pred: 101 %
DLCO unc: 21.91 ml/min/mmHg
FEF 25-75 Post: 3 L/sec
FEF 25-75 Pre: 2.45 L/sec
FEF2575-%Change-Post: 22 %
FEF2575-%Pred-Post: 144 %
FEF2575-%Pred-Pre: 118 %
FEV1-%Change-Post: 5 %
FEV1-%Pred-Post: 97 %
FEV1-%Pred-Pre: 92 %
FEV1-Post: 2.51 L
FEV1-Pre: 2.37 L
FEV1FVC-%Change-Post: 2 %
FEV1FVC-%Pred-Pre: 105 %
FEV6-%Change-Post: 3 %
FEV6-%Pred-Post: 94 %
FEV6-%Pred-Pre: 91 %
FEV6-Post: 3.05 L
FEV6-Pre: 2.95 L
FEV6FVC-%Pred-Post: 104 %
FEV6FVC-%Pred-Pre: 104 %
FVC-%Change-Post: 3 %
FVC-%Pred-Post: 90 %
FVC-%Pred-Pre: 87 %
FVC-Post: 3.05 L
FVC-Pre: 2.95 L
Post FEV1/FVC ratio: 82 %
Post FEV6/FVC ratio: 100 %
Pre FEV1/FVC ratio: 80 %
Pre FEV6/FVC Ratio: 100 %
RV % pred: 72 %
RV: 1.7 L
TLC % pred: 88 %
TLC: 4.88 L

## 2021-06-15 NOTE — Progress Notes (Signed)
Full PFT performed today. °

## 2021-06-15 NOTE — Telephone Encounter (Signed)
Jennifer Fletcher would like her mother to be seen before April. Said she has declined more since seeing Tat. She fell, her memory is getting worse. She is freezing again, cant move around. Jennifer Fletcher said Tat could leave her a message in her moms mychart as well. ?

## 2021-06-15 NOTE — Patient Instructions (Addendum)
-   Referral made to cardiology today ?- Four month follow up in our clinic - let us know if you'd like to change the  appointment depending on evaluation with cardiology ?

## 2021-06-15 NOTE — Telephone Encounter (Signed)
I have sent patients daughter a My chart message as she has requested for communication ?

## 2021-06-15 NOTE — Patient Instructions (Signed)
Full PFT performed today. °

## 2021-07-25 ENCOUNTER — Encounter: Payer: Self-pay | Admitting: Cardiology

## 2021-07-25 ENCOUNTER — Ambulatory Visit (INDEPENDENT_AMBULATORY_CARE_PROVIDER_SITE_OTHER): Payer: Medicare Other | Admitting: Cardiology

## 2021-07-25 VITALS — BP 140/64 | HR 76 | Ht 67.0 in | Wt 211.8 lb

## 2021-07-25 DIAGNOSIS — E785 Hyperlipidemia, unspecified: Secondary | ICD-10-CM | POA: Diagnosis not present

## 2021-07-25 DIAGNOSIS — G20A1 Parkinson's disease without dyskinesia, without mention of fluctuations: Secondary | ICD-10-CM

## 2021-07-25 DIAGNOSIS — R0602 Shortness of breath: Secondary | ICD-10-CM | POA: Diagnosis not present

## 2021-07-25 DIAGNOSIS — G20 Parkinson's disease: Secondary | ICD-10-CM | POA: Diagnosis not present

## 2021-07-25 DIAGNOSIS — R9431 Abnormal electrocardiogram [ECG] [EKG]: Secondary | ICD-10-CM

## 2021-07-25 DIAGNOSIS — F410 Panic disorder [episodic paroxysmal anxiety] without agoraphobia: Secondary | ICD-10-CM | POA: Diagnosis not present

## 2021-07-25 NOTE — Progress Notes (Addendum)
Cardiology Consultation:    Date:  07/25/2021   ID:  [name], DOB 1950/06/25, MRN [id number]  PCP:  Oval Linsey, PA-C  Cardiologist:  Gypsy Balsam, MD   Referring MD: Omar Person, MD   Chief Complaint  Patient presents with   Shortness of Breath    Ongoing sx's since last June.    Cough    Ongoing February    History of Present Illness:    Jennifer Fletcher is a 71 y.o. female who is being seen today for the evaluation of dyspnea on exertion at the request of Omar Person, MD. past medical history significant for essential hypertension, cholesterol status unknown, panic disorder, Parkinson disease.  She was sent to [place] because of episode of abrupt shortness of breath.  She thinks she is having panic attacks when it happens it also/associated with some pounding in the chest as well as some uneasy sensation in the chest but she does not have any typical tightness squeezing pressure burning chest pain when she exercises does some activity.  She does have Parkinson disease for 12 years already.  Look like bradykinesia this is majority manifestation of this I do not see any shakes on my physical exam.  She is on Sinemet already.  She was told to have panic attacks and she was giving some Zoloft as well as clonazepam to help with this.  She never smoked she never had a heart attack she never had a heart problem.  I do see his 2 troponins in the computer which both negative.  She is not on any special diet.  Past Medical History:  Diagnosis Date   Back pain, lumbosacral 08/05/2020   Last Assessment & Plan:  Formatting of this note might be different from the original. Chronic stable EDX results negative neuropathy/radiculopathy Continue GABAPENTIN   Dysphonia 01/27/2021   Formatting of this note might be different from the original. Added automatically from request for surgery [id number]   Goiter 01/27/2021   Formatting of this note might be different from the original.  Added automatically from request for surgery [id number]  Formatting of this note might be different from the original. Added automatically from request for surgery [id number]   Hypersomnia 05/02/2021   Last Assessment & Plan:  Formatting of this note might be different from the original. Undiagnosed new problem with uncertain prognosis Epworth sleepiness score:Total score: 12  Associated with snoring/Abn movements in sleep with dreams Will schedule PSG to evaluate for OSA and PArasomnias   Parkinson disease (HCC)    Polyneuropathy 08/05/2020   Last Assessment & Plan:  Formatting of this note might be different from the original. Chronicprogressing Will fup with EDX results Continue GABAPENTIN   Shortness of breath 05/02/2021   Last Assessment & Plan:  Formatting of this note might be different from the original. Chronic intermittent Already work up in ER Will check  PFT ?sec to anxiety/panic attacks If work up is normal recommend a trial with xanax   Weakness of both lower extremities 02/10/2021   Last Assessment & Plan:  Formatting of this note might be different from the original. Undiagnosed new problem with uncertain prognosis Concerned about fall risk and worsening of back pain and preserved DTR with neruopathy Will schedule MRI LS spine WO contrast to r/o cord compresion Will schedule for neuropathy/radiculopath/myopathy Counseled fall precautions    Past Surgical History:  Procedure Laterality Date   galbladder removal     GALLBLADDER SURGERY     THYROIDECTOMY  TUBAL LIGATION      Current Medications: Current Meds  Medication Sig   aspirin 81 MG chewable tablet Chew by mouth.   carbidopa-levodopa (SINEMET CR) 50-200 MG tablet Take 1 tablet by mouth at bedtime.   cetirizine (ZYRTEC) 10 MG tablet Take by mouth.   clonazePAM (KLONOPIN) 0.5 MG tablet Take by mouth daily.   diclofenac (VOLTAREN) 75 MG EC tablet Take 75 mg by mouth 2 (two) times daily.   gabapentin (NEURONTIN) 300 MG capsule  Take by mouth 3 (three) times daily.   levothyroxine (SYNTHROID) 112 MCG tablet    pramipexole (MIRAPEX) 0.5 MG tablet Take by mouth.   sertraline (ZOLOFT) 25 MG tablet Take 25 mg by mouth at bedtime.   tiZANidine (ZANAFLEX) 4 MG tablet Take by mouth.   [DISCONTINUED] carbidopa-levodopa (SINEMET IR) 25-100 MG tablet Take 2 tablets in AM 4-6 single tablets throughout the day with last pill no later than 4 pm     Allergies:   Codeine   Social History   Socioeconomic History   Marital status: Divorced    Spouse name: Not on file   Number of children: Not on file   Years of education: Not on file   Highest education level: Not on file  Occupational History   Not on file  Tobacco Use   Smoking status: Never   Smokeless tobacco: Never  Vaping Use   Vaping Use: Never used  Substance and Sexual Activity   Alcohol use: Yes    Comment: very rarely   Drug use: Not Currently   Sexual activity: Not on file  Other Topics Concern   Not on file  Social History Narrative   Left handed    Lives with family   One story home    Social Determinants of Health   Financial Resource Strain: Not on file  Food Insecurity: Not on file  Transportation Needs: Not on file  Physical Activity: Not on file  Stress: Not on file  Social Connections: Not on file     Family History: The patient's family history includes COPD in her father; High blood pressure in her mother; Lung cancer in her father. ROS:   Please see the history of present illness.    All 14 point review of systems negative except as described per history of present illness.  EKGs/Labs/Other Studies Reviewed:    The following studies were reviewed today: I did review CT done in the hospital for PE is negative there is no mentioning of a calcification however technique was not sufficient to assess that  EKG:  EKG is  ordered today.  The ekg ordered today demonstrates normal sinus rhythm, criteria for LVH, nonspecific ST segment  changes.  Recent Labs: 05/07/2021: BUN 20; Creatinine, Ser 0.83; Hemoglobin 13.6; Platelets 243; Potassium 3.8; Sodium 139  Recent Lipid Panel No results found for: CHOL, TRIG, HDL, CHOLHDL, VLDL, LDLCALC, LDLDIRECT  Physical Exam:    VS:  BP 140/64 (BP Location: Right Arm, Patient Position: Sitting)   Pulse 76   Ht 5\' 7"  (1.702 m)   Wt 211 lb 12.8 oz (96.1 kg)   SpO2 97%   BMI 33.17 kg/m     Wt Readings from Last 3 Encounters:  07/25/21 211 lb 12.8 oz (96.1 kg)  06/15/21 211 lb (95.7 kg)  05/13/21 206 lb 9.6 oz (93.7 kg)     GEN:  Well nourished, well developed in no acute distress HEENT: Normal NECK: No JVD; No carotid bruits LYMPHATICS: No lymphadenopathy  CARDIAC: RRR, no murmurs, no rubs, no gallops RESPIRATORY:  Clear to auscultation without rales, wheezing or rhonchi  ABDOMEN: Soft, non-tender, non-distended MUSCULOSKELETAL:  No edema; No deformity  SKIN: Warm and dry NEUROLOGIC:  Alert and oriented x 3 PSYCHIATRIC:  Normal affect   ASSESSMENT:    1. Shortness of breath   2. Parkinson disease (HCC)   3. Panic disorder    PLAN:    In order of problems listed above:  Shortness of breath.  Could be related to her heart.  I think we need to rule out cardiomyopathy and I will do it by doing echocardiogram.  Also will be reasonable to make sure she does not have any coronary artery disease because of her anxiety a parkinsonian panic disorder reluctant to rush to any aggressive testing right away I will start with calcium score.  If calcium score is very low then we simply abandon this and investigation but of course if calcium score will be elevated then will consider stress test or maybe even cardiac catheterization. Parkinson's.  That being managed by internal medicine team as well as neurology. Panic disorder/anxiety.  We had a long discussion about this I think she can benefit him some psychiatric evaluation and psychotherapy.  Of course we have to rule out heart  problem first before we commit her to this diagnosis. Dyslipidemia - will do calcium score to assess need for meds   Medication Adjustments/Labs and Tests Ordered: Current medicines are reviewed at length with the patient today.  Concerns regarding medicines are outlined above.  No orders of the defined types were placed in this encounter.  No orders of the defined types were placed in this encounter.   Signed, Georgeanna Lea, MD, [facility]. 07/25/2021 2:24 PM    [facility]

## 2021-07-25 NOTE — Patient Instructions (Signed)
Medication Instructions:  ?Your physician recommends that you continue on your current medications as directed. Please refer to the Current Medication list given to you today.  ?*If you need a refill on your cardiac medications before your next appointment, please call your pharmacy* ? ? ?Lab Work: ?Your physician recommends that you return for lab- Fasting Lipid, AST, ALT  ?If you have labs (blood work) drawn today and your tests are completely normal, you will receive your results only by: ?MyChart Message (if you have MyChart) OR ?A paper copy in the mail ?If you have any lab test that is abnormal or we need to change your treatment, we will call you to review the results. ? ? ?Testing/Procedures: ?Your physician has requested that you have an echocardiogram. Echocardiography is a painless test that uses sound waves to create images of your heart. It provides your doctor with information about the size and shape of your heart and how well your heart?s chambers and valves are working. This procedure takes approximately one hour. There are no restrictions for this procedure.  ? ?We will order CT coronary calcium score. ?It will cost $99.00 and is not covered by insurance.  ?Please call to schedule.   ? ?[facility]  ?[address]  ?[place], [place] [address] ?[phone] ?           Or ?[facility] ?[address] ?[place], [place] [address] ?[phone]   ? ? ?Follow-Up: ?At [facility], you and your health needs are our priority.  As part of our continuing mission to provide you with exceptional heart care, we have created designated Provider Care Teams.  These Care Teams include your primary Cardiologist (physician) and Advanced Practice Providers (APPs -  Physician Assistants and Nurse Practitioners) who all work together to provide you with the care you need, when you need it. ? ?We recommend signing up for the patient portal called "MyChart".  Sign up information is provided  on this After Visit Summary.  MyChart is used to connect with patients for Virtual Visits (Telemedicine).  Patients are able to view lab/test results, encounter notes, upcoming appointments, etc.  Non-urgent messages can be sent to your provider as well.   ?To learn more about what you can do with MyChart, go to [email or web address].   ? ?Your next appointment:   ?3 month(s) ? ?The format for your next appointment:   ?In Person ? ?Provider:   ?Gypsy Balsam, MD  ? ? ?Other Instructions ?None ? ?Important Information About Sugar ? ? ? ? ? ? ?

## 2021-07-30 DIAGNOSIS — G4752 REM sleep behavior disorder: Secondary | ICD-10-CM | POA: Insufficient documentation

## 2021-08-02 ENCOUNTER — Ambulatory Visit (INDEPENDENT_AMBULATORY_CARE_PROVIDER_SITE_OTHER): Payer: Medicare Other

## 2021-08-02 DIAGNOSIS — R0602 Shortness of breath: Secondary | ICD-10-CM | POA: Diagnosis not present

## 2021-08-02 LAB — ECHOCARDIOGRAM COMPLETE
Area-P 1/2: 3.08 cm2
S' Lateral: 3.4 cm

## 2021-08-04 ENCOUNTER — Encounter: Payer: Self-pay | Admitting: Cardiology

## 2021-08-09 ENCOUNTER — Telehealth: Payer: Self-pay | Admitting: Cardiology

## 2021-08-09 ENCOUNTER — Encounter: Payer: Self-pay | Admitting: Cardiology

## 2021-08-09 NOTE — Telephone Encounter (Signed)
Spoke with daughter Luck Sink- Per DPR- regarding note about Calcium Score test. She stated that she would take care of scheduling this. Encouraged her to call if she has any questions or concerns.  ?

## 2021-08-09 NOTE — Progress Notes (Signed)
? ? ?Assessment/Plan:  ? ? ?1.  Parkinson's disease, diagnosed 2014 ?            -She is now prescribed carbidopa/levodopa 25/100, 2 tablets at 4 AM (she gets out of bed at this time) and told she could take an additional 4-6 single tablets throughout the day.  She reports that she is taking 2 tablets at 4 AM, 1 at 9 AM and 1 at 4 PM.  She sometimes takes an additional.  She overall is doing fairly well, despite the fact that this is really a reduction in dose (she was taking carbidopa/levodopa 50/200 CR, 1 tablet 4 times per day).  She feels better on the immediate release.  She is still fairly dyskinetic ?            -For now, she will continue pramipexole 0.5 mg 3 times per day.  I considered that previously but she is not having problems with illusions/hallucinations now ?            -Discussed amantadine.  We decided to trial low dose.  Watch for confusion/hallucinations.  She will take amantadine, 100 mg with the first 2 dosages of levodopa. ?            -I talked to the patient about the logistics associated with DBS therapy.  We talked about this last visit as well as this visit.  I talked to the patient about risks/benefits/side effects of DBS therapy.  We talked about risks which included but were not limited to infection, paralysis, intraoperative seizure, death, stroke, bleeding around the electrode.   I talked to patient about fiducial placement 1 week prior to DBS therapy.  I talked to the patient about what to expect in the operating room, including the fact that this is an awake surgery.  We talked about battery placement as well as which is done under general anesthesia, generally approximately one week following the initial surgery.  We also talked about the fact that the patient will need to be off of medications for surgery.  The patient and family were given the opportunity to ask questions, which they did, and I answered them to the best of my ability today.  She is going to talk to her family  further about this.  We did discuss work-up related to this including neurocognitive testing and levodopa challenge test. ? ?Subjective:  ? ?Jennifer Fletcher was seen today in follow up for Parkinsons disease.  My previous records were reviewed prior to todays visit as well as outside records available to me.  When I saw the patient last visit, we decided to stop the daytime carbidopa/levodopa 50/200 CR that she was taking and switch it to immediate release formulation.  We discussed with her that this could certainly increase dyskinesia, but may help her to be able to move better and hopefully would potentially be able to help what were described as anxious/panic attacks.  She was also referred for physical therapy.  She reports today that she is taking 2 in the AM and generally takes 3-4 tablets in the day.  She is more dyskinetic.  However, she thinks she overall feels better on the immediate release formulation.  She is still feeling panicky however.  She is going to pulmonary though and she is having some tests (coronary Ca score) to see if that is why she feels panicky as she feels SOB as well.   ? ?Current prescribed movement disorder medications: ?carbidopa/levodopa 25/100, 2 tablets  at 4 AM (she gets out of bed at this time), and take 4-6 additional single tablets throughout the day, with the last being at 4 PM (she goes to bed at 7 PM) ?Pramipexole 0.5 mg at bedtime at 4am/noon/6pm ?Gabapentin, 300 mg 3 times daily ? ? ?ALLERGIES:   ?Allergies  ?Allergen Reactions  ? Codeine Nausea And Vomiting  ? ? ?CURRENT MEDICATIONS:  ?Current Meds  ?Medication Sig  ? aspirin 81 MG chewable tablet Chew by mouth.  ? carbidopa-levodopa (SINEMET IR) 25-100 MG tablet Take 2 tablets by mouth. 2 tablets in the morning and 4 - 6 tablets as needed before 4 pm  ? cetirizine (ZYRTEC) 10 MG tablet Take by mouth.  ? Cholecalciferol (D3 PO) Take by mouth.  ? clonazePAM (KLONOPIN) 0.5 MG tablet Take by mouth daily.  ? diclofenac  (VOLTAREN) 75 MG EC tablet Take 75 mg by mouth 2 (two) times daily.  ? gabapentin (NEURONTIN) 300 MG capsule Take by mouth 3 (three) times daily.  ? levothyroxine (SYNTHROID) 112 MCG tablet   ? pramipexole (MIRAPEX) 0.5 MG tablet Take by mouth.  ? sertraline (ZOLOFT) 50 MG tablet Take 50 mg by mouth daily.  ? tiZANidine (ZANAFLEX) 4 MG tablet Take by mouth.  ? ? ? ?Objective:  ? ?PHYSICAL EXAMINATION:   ? ?VITALS:   ?Vitals:  ? 08/11/21 1303  ?BP: 104/62  ?Pulse: 68  ?SpO2: 96%  ?Weight: 212 lb 9.6 oz (96.4 kg)  ?Height: 5\' 7"  (1.702 m)  ? ? ?GEN:  The patient appears stated age and is in NAD. ?HEENT:  Normocephalic, atraumatic.  The mucous membranes are moist. The superficial temporal arteries are without ropiness or tenderness. ? ? ?Neurological examination: ? ?Orientation: The patient is alert and oriented x3. ?Cranial nerves: There is good facial symmetry with facial hypomimia. The speech is fluent and clear. Soft palate rises symmetrically and there is no tongue deviation. Hearing is intact to conversational tone. ?Sensation: Sensation is intact to light touch throughout ?Motor: Strength is at least antigravity x4. ? ?Movement examination: ?Tone: There is nl tone in the bilateral upper extremities.  The tone in the lower extremities is nl.  ?Abnormal movements: there is mod axial and LLE dyskinesia (took about 45 min ago) ?Coordination:  There is min decremation with RAM's, only with action of turning in light bulb on the right and finger taps on the right ?Gait and Station: The patient pushes off to arise.  The patient's stride length is normal (improved) ? ?I have reviewed and interpreted the following labs independently ? ?  Chemistry   ?   ?Component Value Date/Time  ? NA 139 05/07/2021 0956  ? K 3.8 05/07/2021 0956  ? CL 102 05/07/2021 0956  ? CO2 26 05/07/2021 0956  ? BUN 20 05/07/2021 0956  ? CREATININE 0.83 05/07/2021 0956  ?    ?Component Value Date/Time  ? CALCIUM 9.3 05/07/2021 0956  ?  ? ? ? ?Lab  Results  ?Component Value Date  ? WBC 9.1 05/07/2021  ? HGB 13.6 05/07/2021  ? HCT 44.2 05/07/2021  ? MCV 100.5 (H) 05/07/2021  ? PLT 243 05/07/2021  ? ? ?No results found for: TSH ? ? ?Total time spent on today's visit was 30 minutes, including both face-to-face time and nonface-to-face time.  Time included that spent on review of records (prior notes available to me/labs/imaging if pertinent), discussing treatment and goals, answering patient's questions and coordinating care. ? ?Cc:  05/09/2021, PA-C ? ?

## 2021-08-09 NOTE — Telephone Encounter (Signed)
Pt's neighbor states that she was told to call back with name, address and fax number of location for pt to have CT Coronary Calcium Score done. ? ?[facility] [employer]) ? Fax - [phone] ?Attn: Centralized Scheduling ? ?Please advise ?

## 2021-08-09 NOTE — Telephone Encounter (Signed)
Error

## 2021-08-10 ENCOUNTER — Other Ambulatory Visit: Payer: Self-pay

## 2021-08-10 DIAGNOSIS — R29898 Other symptoms and signs involving the musculoskeletal system: Secondary | ICD-10-CM

## 2021-08-10 DIAGNOSIS — R0602 Shortness of breath: Secondary | ICD-10-CM

## 2021-08-10 DIAGNOSIS — F410 Panic disorder [episodic paroxysmal anxiety] without agoraphobia: Secondary | ICD-10-CM

## 2021-08-10 DIAGNOSIS — E785 Hyperlipidemia, unspecified: Secondary | ICD-10-CM

## 2021-08-10 NOTE — Addendum Note (Signed)
Addended by: Jacobo Forest D on: 08/10/2021 01:09 PM ? ? Modules accepted: Orders ? ?

## 2021-08-10 NOTE — Telephone Encounter (Signed)
Called patient's daughter per DPR to find out more information. Calcium score test was not ordered. Attempted to order the calcium score test per Dr. Agustin Cree. Unable to order the test because none of the diagnosis for the patient will allow me to order the test. Sent Dr. Agustin Cree a staff message regarding this so he can enter a diagnosis to cover the calcium score test so I can order it. ?

## 2021-08-10 NOTE — Telephone Encounter (Signed)
? ?  Pt trying to call to schedule calcium score but there's not order on file. She also wants to complaint the CT lab [facility] office when she called to schedule her appt she said they are rude to her ?

## 2021-08-10 NOTE — Telephone Encounter (Signed)
Called patient's daughter and informed her not to call and try to schedule the calcium score test until we reach out to her to let her know that the order is in Epic. She was appreciative of the call and had no further questions. ?

## 2021-08-11 ENCOUNTER — Ambulatory Visit (INDEPENDENT_AMBULATORY_CARE_PROVIDER_SITE_OTHER): Payer: Medicare Other | Admitting: Neurology

## 2021-08-11 ENCOUNTER — Encounter: Payer: Self-pay | Admitting: Neurology

## 2021-08-11 VITALS — BP 104/62 | HR 68 | Ht 67.0 in | Wt 212.6 lb

## 2021-08-11 DIAGNOSIS — G20 Parkinson's disease: Secondary | ICD-10-CM

## 2021-08-11 DIAGNOSIS — G249 Dystonia, unspecified: Secondary | ICD-10-CM | POA: Diagnosis not present

## 2021-08-11 MED ORDER — AMANTADINE HCL 100 MG PO CAPS: 100.0000 mg | ORAL_CAPSULE | Freq: Two times a day (BID) | ORAL | 1 refills | Status: DC

## 2021-08-11 NOTE — Patient Instructions (Signed)
Start amantadine 100 mg, 1 capsule twice per day ?

## 2021-08-13 LAB — LIPID PANEL
Chol/HDL Ratio: 5.1 ratio — ABNORMAL HIGH (ref 0.0–4.4)
Cholesterol, Total: 241 mg/dL — ABNORMAL HIGH (ref 100–199)
HDL: 47 mg/dL (ref >39)
LDL Chol Calc (NIH): 162 mg/dL — ABNORMAL HIGH (ref 0–99)
Triglycerides: 176 mg/dL — ABNORMAL HIGH (ref 0–149)
VLDL Cholesterol Cal: 32 mg/dL (ref 5–40)

## 2021-08-13 LAB — ALT: ALT: 6 [IU]/L (ref 0–32)

## 2021-08-13 LAB — AST: AST: 13 IU/L (ref 0–40)

## 2021-08-18 ENCOUNTER — Telehealth: Payer: Self-pay

## 2021-08-18 DIAGNOSIS — E785 Hyperlipidemia, unspecified: Secondary | ICD-10-CM

## 2021-08-18 MED ORDER — ATORVASTATIN CALCIUM 10 MG PO TABS: 10.0000 mg | ORAL_TABLET | Freq: Every day | ORAL | 2 refills | Status: AC

## 2021-08-18 NOTE — Telephone Encounter (Signed)
-----   Message from Georgeanna Lea, MD sent at 08/17/2021  9:22 PM EDT ----- ?Cholesterol very poor she need to start cholesterol medication I would suggest Lipitor 10 mg daily ?

## 2021-08-18 NOTE — Telephone Encounter (Signed)
Spoke with [place] Sink, notified of results and recommendations and agreed with plan. Rx sent  ?

## 2021-08-23 NOTE — Telephone Encounter (Signed)
Jennifer Lea, MD  You 3 hours ago (1:09 PM)  ? ?yes   ? ? You  Jennifer Lea, MD 5 days ago  ? ?Do you want this patient to repeat blood work in 6 weeks to recheck cholesterol?   ? ?LM to return my call. Lab order on file ?

## 2021-08-23 NOTE — Addendum Note (Signed)
Addended by: Heywood Bene on: 08/23/2021 05:01 PM ? ? Modules accepted: Orders ? ?

## 2021-08-29 NOTE — Telephone Encounter (Signed)
LM to return my call. 

## 2021-08-30 NOTE — Telephone Encounter (Signed)
Spoke with [place] Sink advised to have the patient come by fasting for BW in 6 weeks. Order on file ?

## 2021-09-22 ENCOUNTER — Encounter: Payer: Self-pay | Admitting: Cardiology

## 2021-09-22 ENCOUNTER — Ambulatory Visit
Admission: RE | Admit: 2021-09-22 | Discharge: 2021-09-22 | Disposition: A | Discharge status: Home / Self Care | Payer: Self-pay | Source: Ambulatory Visit | Attending: Cardiology | Admitting: Cardiology

## 2021-09-22 DIAGNOSIS — R9431 Abnormal electrocardiogram [ECG] [EKG]: Secondary | ICD-10-CM

## 2021-09-22 DIAGNOSIS — E785 Hyperlipidemia, unspecified: Secondary | ICD-10-CM

## 2021-10-28 ENCOUNTER — Other Ambulatory Visit: Payer: Self-pay | Admitting: Neurology

## 2021-11-10 ENCOUNTER — Ambulatory Visit: Payer: Medicare Other | Admitting: Cardiology

## 2021-11-22 ENCOUNTER — Ambulatory Visit (INDEPENDENT_AMBULATORY_CARE_PROVIDER_SITE_OTHER): Payer: Medicare Other | Admitting: Allergy

## 2021-11-22 ENCOUNTER — Encounter: Payer: Self-pay | Admitting: Allergy

## 2021-11-22 ENCOUNTER — Other Ambulatory Visit: Payer: Self-pay

## 2021-11-22 VITALS — BP 130/78 | HR 78 | Resp 16 | Ht 66.0 in | Wt 225.6 lb

## 2021-11-22 DIAGNOSIS — J4540 Moderate persistent asthma, uncomplicated: Secondary | ICD-10-CM | POA: Diagnosis not present

## 2021-11-22 DIAGNOSIS — R0602 Shortness of breath: Secondary | ICD-10-CM | POA: Diagnosis not present

## 2021-11-22 DIAGNOSIS — H1013 Acute atopic conjunctivitis, bilateral: Secondary | ICD-10-CM

## 2021-11-22 DIAGNOSIS — H109 Unspecified conjunctivitis: Secondary | ICD-10-CM

## 2021-11-22 DIAGNOSIS — J310 Chronic rhinitis: Secondary | ICD-10-CM

## 2021-11-22 MED ORDER — FLUTICASONE PROPIONATE 50 MCG/ACT NA SUSP: NASAL | 5 refills | Status: AC

## 2021-11-22 MED ORDER — ALBUTEROL SULFATE HFA 108 (90 BASE) MCG/ACT IN AERS: 2.0000 | INHALATION_SPRAY | Freq: Four times a day (QID) | RESPIRATORY_TRACT | 1 refills | Status: AC | PRN

## 2021-11-22 MED ORDER — FEXOFENADINE HCL 180 MG PO TABS: 180.0000 mg | ORAL_TABLET | Freq: Every day | ORAL | 5 refills | Status: AC

## 2021-11-22 MED ORDER — AZELASTINE HCL 0.15 % NA SOLN: NASAL | 5 refills | Status: AC

## 2021-11-22 MED ORDER — MONTELUKAST SODIUM 10 MG PO TABS: 10.0000 mg | ORAL_TABLET | Freq: Every day | ORAL | 5 refills | Status: DC

## 2021-11-22 MED ORDER — ALBUTEROL SULFATE (2.5 MG/3ML) 0.083% IN NEBU: 2.5000 mg | INHALATION_SOLUTION | Freq: Four times a day (QID) | RESPIRATORY_TRACT | 2 refills | Status: AC | PRN

## 2021-11-22 NOTE — Patient Instructions (Signed)
-   Testing today showed: negative  - Stop taking: Claritin, Zyrtec and Xyzal as these do not seem to be effective antihistamines.  - Continue with: Flonase (fluticasone) two sprays per nostril daily (AIM FOR EAR ON EACH SIDE) for 1-2 weeks at a time before stopping once nasal congestion improves for maximum benefit. - Start taking: Allegra (fexofenadine) 180mg  tablet once daily.  This is a long-acting antihistamine you have not tried before. Singulair 10mg  daily at bedtime.  This is an allergy and reactive airway medication to help with shortness of breath.  If you notice any change in mood/behavior/sleep after starting Singulair then stop this medication and let know.  Symptoms resolve after stopping the medication.   Azelastine nasal spray 2 sprays each nostril twice a day as needed for nasal drainage or throat clearing.   - You can use an extra dose of the antihistamine, if needed, for breakthrough symptoms.  - Consider nasal saline rinses 1-2 times daily to remove allergens from the nasal cavities as well as help with mucous clearance (this is especially helpful to do before the nasal sprays are given)  - Neb and teaching provided. - Daily controller medication(s): Singulair 10mg  daily as above - Prior to physical activity: albuterol 2 puffs 10-15 minutes before physical activity. - Rescue medications: albuterol 2 puffs via inhaler OR albuterol 1 vial via nebulizer every 4 hours as needed  - Can try breathing techniques first and if shortness of breath not improved within minutes then use albuterol - Lung function is normal today  - Breathing control goals:  * Full participation in all desired activities (may need albuterol before activity) * Albuterol use two time or less a week on average (not counting use with activity) * Cough interfering with sleep two time or less a month * Oral steroids no more than once a year * No hospitalizations  Follow-up in 3-4 months or sooner if needed

## 2021-11-22 NOTE — Progress Notes (Signed)
New Patient Note  RE: Jennifer Fletcher MRN: [id number] DOB: 05/11/1950 Date of Office Visit: 11/22/2021   Primary care provider: Oval Linsey, PA-C  Chief Complaint: allergies and shortness of breath  History of present illness: Jennifer Fletcher is a 71 y.o. female presenting today for evaluation of allergies and ?asthma.    She reports nasal congestion, runny nose, itchy/watery eyes, sneezing, throat clearing, ear itch.  These symptoms are year-round but can be worse in the spring.   She has used Zytec, Claritin and Xyzal.  Currently taking Claritin in the morning and xyzal at night.  These antihistamines all work about the same which is not doing much.  She also reports using flonase in the mornings and does help with the nasal congestion.  Has not used an eye drop. She has done nasal saline rinses before and does tolerate.    She has been short of breath and daughter feels like its been an issue over a year now.  She reports some wheezing but not very much.  She does report a lot of chest tightness.  She does cough a lot and is mostly productive.  She reports activity makes her more short of breath.  She also reports if she gets upset can cause more shortness of breath.  Denies nighttime awakenings. She states she has had 5 ED visits in the past year or so.  She has had a breathing treatment once at the ED and states did help.  She did get a nebulizer machine with albuterol about 1.5 years ago but states never got any albuterol refills thus she ran out.  However she can't find the nebulizer as she states since then she has moved; nebulizer was given to pt at no cost and not filed with insurance.  She will also perform breathing treatment as does report some anxiety/panic attacks.  She had her thyroid removed due to nodules but this did not help with her breathing.   No history of eczema or food allergy.  She reports codiene makes her sick to her stomach with vomiting.  Review of systems in  the past 4 weeks: Review of Systems  Constitutional: Negative.   HENT:  Positive for congestion, postnasal drip and sneezing.   Eyes: Negative.   Respiratory:  Positive for cough and shortness of breath.   Cardiovascular: Negative.   Gastrointestinal: Negative.   Musculoskeletal: Negative.   Skin: Negative.   Allergic/Immunologic: Negative.   Neurological: Negative.     All other systems negative unless noted above in HPI  Past medical history: Past Medical History:  Diagnosis Date   Back pain, lumbosacral 08/05/2020   Last Assessment & Plan:  Formatting of this note might be different from the original. Chronic stable EDX results negative neuropathy/radiculopathy Continue GABAPENTIN   Dysphonia 01/27/2021   Formatting of this note might be different from the original. Added automatically from request for surgery [id number]   Goiter 01/27/2021   Formatting of this note might be different from the original. Added automatically from request for surgery [id number]  Formatting of this note might be different from the original. Added automatically from request for surgery [id number]   Hypersomnia 05/02/2021   Last Assessment & Plan:  Formatting of this note might be different from the original. Undiagnosed new problem with uncertain prognosis Epworth sleepiness score:Total score: 12  Associated with snoring/Abn movements in sleep with dreams Will schedule PSG to evaluate for OSA and PArasomnias   Parkinson disease (HCC)    Polyneuropathy  08/05/2020   Last Assessment & Plan:  Formatting of this note might be different from the original. Chronicprogressing Will fup with EDX results Continue GABAPENTIN   Recurrent upper respiratory infection (URI)    Shortness of breath 05/02/2021   Last Assessment & Plan:  Formatting of this note might be different from the original. Chronic intermittent Already work up in ER Will check  PFT ?sec to anxiety/panic attacks If work up is normal recommend a trial with  xanax   Weakness of both lower extremities 02/10/2021   Last Assessment & Plan:  Formatting of this note might be different from the original. Undiagnosed new problem with uncertain prognosis Concerned about fall risk and worsening of back pain and preserved DTR with neruopathy Will schedule MRI LS spine WO contrast to r/o cord compresion Will schedule for neuropathy/radiculopath/myopathy Counseled fall precautions    Past surgical history: Past Surgical History:  Procedure Laterality Date   galbladder removal     GALLBLADDER SURGERY     THYROIDECTOMY  02/2021   TUBAL LIGATION      Family history:  Family History  Problem Relation Age of Onset   High blood pressure Mother    COPD Father    Lung cancer Father     Social history: Lives in a home without carpeting with heat pump heating and cooling.  No pets in the home.  Cats outside the home.  No concern for water damage, mildew or roaches in the home.  She is retired.  Denies smoking history   Medication List: Current Outpatient Medications  Medication Sig Dispense Refill   amantadine (SYMMETREL) 100 MG capsule Take 1 capsule (100 mg total) by mouth 2 (two) times daily. 180 capsule 1   aspirin 81 MG chewable tablet Chew by mouth.     carbidopa-levodopa (SINEMET IR) 25-100 MG tablet TAKE 2 TABLETS IN MORNING, 4- 6 TABLETS THROUGHOUT THE DAY WITH LAST PILL NO LATER THAN 4PM 720 tablet 0   cetirizine (ZYRTEC) 10 MG tablet Take by mouth.     Cholecalciferol (D3 PO) Take by mouth.     clonazePAM (KLONOPIN) 0.5 MG tablet Take by mouth daily.     gabapentin (NEURONTIN) 300 MG capsule Take by mouth 3 (three) times daily.     levothyroxine (SYNTHROID) 112 MCG tablet      pramipexole (MIRAPEX) 0.5 MG tablet Take by mouth.     sertraline (ZOLOFT) 50 MG tablet Take 50 mg by mouth daily.     tiZANidine (ZANAFLEX) 4 MG tablet Take by mouth.     atorvastatin (LIPITOR) 10 MG tablet Take 1 tablet (10 mg total) by mouth daily. 30 tablet 2    diclofenac (VOLTAREN) 75 MG EC tablet Take 75 mg by mouth 2 (two) times daily. (Patient not taking: Reported on 11/22/2021)     No current facility-administered medications for this visit.    Known medication allergies: Allergies  Allergen Reactions   Codeine Nausea And Vomiting     Physical examination: Blood pressure 130/78, pulse 78, resp. rate 16, height 5\' 6"  (1.676 m), weight 225 lb 9.6 oz (102.3 kg), SpO2 95 %.  General: Alert, interactive, in no acute distress. HEENT: PERRLA, TMs pearly gray, turbinates minimally edematous without discharge, post-pharynx non erythematous. Neck: Supple without lymphadenopathy. Lungs: Clear to auscultation without wheezing, rhonchi or rales. {no increased work of breathing. CV: Normal S1, S2 without murmurs. Abdomen: Nondistended, nontender. Skin: Warm and dry, without lesions or rashes. Extremities:  No clubbing, cyanosis or edema. Neuro:  Grossly intact.  Diagnositics/Labs: Spirometry: FEV1: 2.01L 86%, FVC: 2.55L 84%, ratio consistent with nonobstructive pattern  Allergy testing:   Airborne Adult Perc - 11/22/21 1022     Time Antigen Placed 1022    Allergen Manufacturer Greer    Location Back    Number of Test 59    1. Control-Buffer 50% Glycerol Negative    2. Control-Histamine 1 mg/ml 2+    3. Albumin saline Negative    4. Bahia Negative    5. French Southern Territories Negative    6. Johnson Negative    7. Kentucky Blue Negative    8. Meadow Fescue Negative    9. Perennial Rye Negative    10. Sweet Vernal Negative    11. Timothy Negative    12. Cocklebur Negative    13. Burweed Marshelder Negative    14. Ragweed, short Negative    15. Ragweed, Giant Negative    16. Plantain,  English Negative    17. Lamb's Quarters Negative    18. Sheep Sorrell Negative    19. Rough Pigweed Negative    20. Marsh Elder, Rough Negative    21. Mugwort, Common Negative    22. Ash mix Negative    23. Birch mix Negative    24. Beech American Negative     25. Box, Elder Negative    26. Cedar, red Negative    27. Cottonwood, Guinea-Bissau Negative    28. Elm mix Negative    29. Hickory Negative    30. Maple mix Negative    31. Oak, Guinea-[place]au mix Negative    32. Pecan Pollen Negative    33. Pine mix Negative    34. Sycamore Eastern Negative    35. Walnut, Black Pollen Negative    36. Alternaria alternata Negative    37. Cladosporium Herbarum Negative    38. Aspergillus mix Negative    39. Penicillium mix Negative    40. Bipolaris sorokiniana (Helminthosporium) Negative    41. Drechslera spicifera (Curvularia) Negative    42. Mucor plumbeus Negative    43. Fusarium moniliforme Negative    44. Aureobasidium pullulans (pullulara) Negative    45. Rhizopus oryzae Negative    46. Botrytis cinera Negative    47. Epicoccum nigrum Negative    48. Phoma betae Negative    49. Candida Albicans Negative    50. Trichophyton mentagrophytes Negative    51. Mite, D Farinae  5,000 AU/ml Negative    52. Mite, D Pteronyssinus  5,000 AU/ml Negative    53. Cat Hair 10,000 BAU/ml Negative    54.  Dog Epithelia Negative    55. Mixed Feathers Negative    56. Horse Epithelia Negative    57. Cockroach, German Negative    58. Mouse Negative    59. Tobacco Leaf Negative             Intradermal - 11/22/21 1102     Time Antigen Placed 1102    Allergen Manufacturer Greer    Location Arm    Control Negative    7 Grass Negative    Ragweed mix Negative    Weed mix Negative    Tree mix Negative    Mold 1 Negative    Mold 2 Negative    Mold 3 Negative    Mold 4 Negative    Cat Negative    Dog Negative    Cockroach Negative    Mite mix Negative  Allergy testing results were read and interpreted by provider, documented by clinical staff.   Assessment and plan: Nonallergic rhinoconjunctivitis  - Testing today showed: negative  - Stop taking: Claritin, Zyrtec and Xyzal as these do not seem to be effective antihistamines.  - Continue  with: Flonase (fluticasone) two sprays per nostril daily (AIM FOR EAR ON EACH SIDE) for 1-2 weeks at a time before stopping once nasal congestion improves for maximum benefit. - Start taking: Allegra (fexofenadine) 180mg  tablet once daily.  This is a long-acting antihistamine you have not tried before. Singulair 10mg  daily at bedtime.  This is an allergy and reactive airway medication to help with shortness of breath.  If you notice any change in mood/behavior/sleep after starting Singulair then stop this medication and let know.  Symptoms resolve after stopping the medication.   Azelastine nasal spray 2 sprays each nostril twice a day as needed for nasal drainage or throat clearing.   - You can use an extra dose of the antihistamine, if needed, for breakthrough symptoms.  - Consider nasal saline rinses 1-2 times daily to remove allergens from the nasal cavities as well as help with mucous clearance (this is especially helpful to do before the nasal sprays are given)  Shortness of breath Reactive airway - Neb and teaching provided. - Daily controller medication(s): Singulair 10mg  daily as above - Prior to physical activity: albuterol 2 puffs 10-15 minutes before physical activity. - Rescue medications: albuterol 2 puffs via inhaler OR albuterol 1 vial via nebulizer every 4 hours as needed  - Can try breathing techniques first and if shortness of breath not improved within minutes then use albuterol - Lung function is normal today  - Breathing control goals:  * Full participation in all desired activities (may need albuterol before activity) * Albuterol use two time or less a week on average (not counting use with activity) * Cough interfering with sleep two time or less a month * Oral steroids no more than once a year * No hospitalizations  Follow-up in 3-4 months or sooner if needed  I appreciate the opportunity to take part in Shamariah's care. Please do not hesitate to contact me with  questions.  Sincerely,   , MD Allergy/Immunology [facility]

## 2021-11-23 ENCOUNTER — Ambulatory Visit (INDEPENDENT_AMBULATORY_CARE_PROVIDER_SITE_OTHER): Payer: Medicare Other | Admitting: Cardiology

## 2021-11-23 ENCOUNTER — Encounter: Payer: Self-pay | Admitting: Cardiology

## 2021-11-23 VITALS — BP 140/64 | HR 76 | Ht 67.0 in | Wt 226.2 lb

## 2021-11-23 DIAGNOSIS — E785 Hyperlipidemia, unspecified: Secondary | ICD-10-CM | POA: Diagnosis not present

## 2021-11-23 DIAGNOSIS — R0602 Shortness of breath: Secondary | ICD-10-CM | POA: Diagnosis not present

## 2021-11-23 DIAGNOSIS — F410 Panic disorder [episodic paroxysmal anxiety] without agoraphobia: Secondary | ICD-10-CM | POA: Diagnosis not present

## 2021-11-23 DIAGNOSIS — G20 Parkinson's disease: Secondary | ICD-10-CM | POA: Diagnosis not present

## 2021-11-23 NOTE — Progress Notes (Signed)
Cardiology Office Note:    Date:  11/23/2021   ID:  Jennifer Fletcher, DOB 01/28/1951, MRN [id number]  PCP:  Oval Linsey, PA-C  Cardiologist:  Gypsy Balsam, MD    Referring MD: Oval Linsey, PA-C   Chief Complaint  Patient presents with   Follow-up    History of Present Illness:    Jennifer Fletcher is a 71 y.o. female with past medical history significant for Parkinson, essential hypertension dyslipidemia.  Panic disorder.  She was referred to [place] initially because of dyspnea on exertion.  She did have a echocardiogram done which showed preserved left ventricle ejection fraction.  We also evaluated for coronary artery disease she did have a calcium score done in spite of her being 70 years and presently her calcium score is 0.  There is no explanation for her shortness of breath diastolic heart condition.  She comes today to my office for follow-up overall doing well denies of any chest pain tightness squeezing pressure burning chest.  Past Medical History:  Diagnosis Date   Back pain, lumbosacral 08/05/2020   Last Assessment & Plan:  Formatting of this note might be different from the original. Chronic stable EDX results negative neuropathy/radiculopathy Continue GABAPENTIN   Dysphonia 01/27/2021   Formatting of this note might be different from the original. Added automatically from request for surgery [id number]   Goiter 01/27/2021   Formatting of this note might be different from the original. Added automatically from request for surgery [id number]  Formatting of this note might be different from the original. Added automatically from request for surgery [id number]   Hypersomnia 05/02/2021   Last Assessment & Plan:  Formatting of this note might be different from the original. Undiagnosed new problem with uncertain prognosis Epworth sleepiness score:Total score: 12  Associated with snoring/Abn movements in sleep with dreams Will schedule PSG to evaluate for OSA and PArasomnias   Parkinson  disease (HCC)    Polyneuropathy 08/05/2020   Last Assessment & Plan:  Formatting of this note might be different from the original. Chronicprogressing Will fup with EDX results Continue GABAPENTIN   Recurrent upper respiratory infection (URI)    Shortness of breath 05/02/2021   Last Assessment & Plan:  Formatting of this note might be different from the original. Chronic intermittent Already work up in ER Will check  PFT ?sec to anxiety/panic attacks If work up is normal recommend a trial with xanax   Weakness of both lower extremities 02/10/2021   Last Assessment & Plan:  Formatting of this note might be different from the original. Undiagnosed new problem with uncertain prognosis Concerned about fall risk and worsening of back pain and preserved DTR with neruopathy Will schedule MRI LS spine WO contrast to r/o cord compresion Will schedule for neuropathy/radiculopath/myopathy Counseled fall precautions    Past Surgical History:  Procedure Laterality Date   galbladder removal     GALLBLADDER SURGERY     THYROIDECTOMY  02/2021   TUBAL LIGATION      Current Medications: Current Meds  Medication Sig   albuterol (PROVENTIL) (2.5 MG/3ML) 0.083% nebulizer solution Take 3 mLs (2.5 mg total) by nebulization every 6 (six) hours as needed for wheezing or shortness of breath.   albuterol (VENTOLIN HFA) 108 (90 Base) MCG/ACT inhaler Inhale 2 puffs into the lungs every 6 (six) hours as needed for wheezing or shortness of breath.   amantadine (SYMMETREL) 100 MG capsule Take 1 capsule (100 mg total) by mouth 2 (two) times daily.   aspirin 81  MG chewable tablet Chew 81 mg by mouth daily.   atorvastatin (LIPITOR) 10 MG tablet Take 1 tablet (10 mg total) by mouth daily.   Azelastine HCl 0.15 % SOLN Apply 2 sprays each nostril twice a day as needed for nasal drainage or throat clearing (Patient taking differently: Place 2 sprays into both nostrils 2 (two) times daily as needed (nasal drainage or throat  clearing). Apply 2 sprays each nostril twice a day as needed for nasal drainage or throat clearing)   carbidopa-levodopa (SINEMET IR) 25-100 MG tablet TAKE 2 TABLETS IN MORNING, 4- 6 TABLETS THROUGHOUT THE DAY WITH LAST PILL NO LATER THAN 4PM (Patient taking differently: Take 1 tablet by mouth 3 (three) times daily.)   Cholecalciferol (D3 PO) Take 1 tablet by mouth daily.   clonazePAM (KLONOPIN) 0.5 MG tablet Take 0.5 mg by mouth daily.   diclofenac (VOLTAREN) 75 MG EC tablet Take 75 mg by mouth 2 (two) times daily.   fexofenadine (ALLEGRA) 180 MG tablet Take 1 tablet (180 mg total) by mouth daily.   fluticasone (FLONASE) 50 MCG/ACT nasal spray Apply two sprays per nostril daily (AIM FOR EAR ON EACH SIDE) for 1-2 weeks at a time before stopping (Patient taking differently: Place 1 spray into both nostrils daily. Apply two sprays per nostril daily (AIM FOR EAR ON EACH SIDE) for 1-2 weeks at a time before stopping)   gabapentin (NEURONTIN) 300 MG capsule Take 300 mg by mouth 3 (three) times daily.   levothyroxine (SYNTHROID) 112 MCG tablet Take 112 mcg by mouth daily before breakfast.   montelukast (SINGULAIR) 10 MG tablet Take 1 tablet (10 mg total) by mouth at bedtime.   pramipexole (MIRAPEX) 0.5 MG tablet Take 0.5 mg by mouth 3 (three) times daily.   sertraline (ZOLOFT) 50 MG tablet Take 50 mg by mouth daily.   tiZANidine (ZANAFLEX) 4 MG tablet Take 4 mg by mouth every 8 (eight) hours as needed for muscle spasms.     Allergies:   Codeine   Social History   Socioeconomic History   Marital status: Divorced    Spouse name: Not on file   Number of children: Not on file   Years of education: Not on file   Highest education level: Not on file  Occupational History   Not on file  Tobacco Use   Smoking status: Never   Smokeless tobacco: Never  Vaping Use   Vaping Use: Never used  Substance and Sexual Activity   Alcohol use: Yes    Comment: once a week   Drug use: Not Currently   Sexual  activity: Not on file  Other Topics Concern   Not on file  Social History Narrative   Left handed    Lives with family   One story home    Social Determinants of Health   Financial Resource Strain: Not on file  Food Insecurity: Not on file  Transportation Needs: Not on file  Physical Activity: Not on file  Stress: Not on file  Social Connections: Not on file     Family History: The patient's family history includes COPD in her father; High blood pressure in her mother; Lung cancer in her father. ROS:   Please see the history of present illness.    All 14 point review of systems negative except as described per history of present illness  EKGs/Labs/Other Studies Reviewed:      Recent Labs: 05/07/2021: BUN 20; Creatinine, Ser 0.83; Hemoglobin 13.6; Platelets 243; Potassium 3.8; Sodium  139 08/12/2021: ALT 6  Recent Lipid Panel    Component Value Date/Time   CHOL 241 (H) 08/12/2021 0830   TRIG 176 (H) 08/12/2021 0830   HDL 47 08/12/2021 0830   CHOLHDL 5.1 (H) 08/12/2021 0830   LDLCALC 162 (H) 08/12/2021 0830    Physical Exam:    VS:  BP (!) 140/64 (BP Location: Left Arm, Patient Position: Sitting)   Pulse 76   Ht 5\' 7"  (1.702 m)   Wt 226 lb 3.2 oz (102.6 kg)   SpO2 95%   BMI 35.43 kg/m     Wt Readings from Last 3 Encounters:  11/23/21 226 lb 3.2 oz (102.6 kg)  11/22/21 225 lb 9.6 oz (102.3 kg)  08/11/21 212 lb 9.6 oz (96.4 kg)     GEN:  Well nourished, well developed in no acute distress HEENT: Normal NECK: No JVD; No carotid bruits LYMPHATICS: No lymphadenopathy CARDIAC: RRR, no murmurs, no rubs, no gallops RESPIRATORY:  Clear to auscultation without rales, wheezing or rhonchi  ABDOMEN: Soft, non-tender, non-distended MUSCULOSKELETAL:  No edema; No deformity  SKIN: Warm and dry LOWER EXTREMITIES: no swelling NEUROLOGIC:  Alert and oriented x 3 PSYCHIATRIC:  Normal affect   ASSESSMENT:    1. Shortness of breath   2. Parkinson disease (HCC)   3.  Dyslipidemia   4. Panic disorder    PLAN:    In order of problems listed above:  This will exertion look like not heart related.  Her echocardiogram was normal calcium score 0 low likelihood for coronary artery disease does not have any typical symptoms suggest a problem we will continue present management.  His shortness of breath could be related to low exercise level because of Parkinson. Dyslipidemia I did review her K PN her LDL 162 HDL 47 I calculated 10 years predicted risk which is 16%.  Now with the fact that calcium score is 0 diet and exercise is recommended which we will do. Parkinson's disease followed by internal medicine team. Panic disorder followed by antimedicine team.  I asked her to follow-up on an as-needed basis   Medication Adjustments/Labs and Tests Ordered: Current medicines are reviewed at length with the patient today.  Concerns regarding medicines are outlined above.  No orders of the defined types were placed in this encounter.  Medication changes: No orders of the defined types were placed in this encounter.   Signed, 08/13/21, MD, [facility] 11/23/2021 9:33 AM    [facility]

## 2021-11-23 NOTE — Patient Instructions (Signed)
Medication Instructions:  Your physician recommends that you continue on your current medications as directed. Please refer to the Current Medication list given to you today.  *If you need a refill on your cardiac medications before your next appointment, please call your pharmacy*   Lab Work: NONE If you have labs (blood work) drawn today and your tests are completely normal, you will receive your results only by: MyChart Message (if you have MyChart) OR A paper copy in the mail If you have any lab test that is abnormal or we need to change your treatment, we will call you to review the results.   Testing/Procedures: NONE   Follow-Up: At [facility], you and your health needs are our priority.  As part of our continuing mission to provide you with exceptional heart care, we have created designated Provider Care Teams.  These Care Teams include your primary Cardiologist (physician) and Advanced Practice Providers (APPs -  Physician Assistants and Nurse Practitioners) who all work together to provide you with the care you need, when you need it.  We recommend signing up for the patient portal called "MyChart".  Sign up information is provided on this After Visit Summary.  MyChart is used to connect with patients for Virtual Visits (Telemedicine).  Patients are able to view lab/test results, encounter notes, upcoming appointments, etc.  Non-urgent messages can be sent to your provider as well.   To learn more about what you can do with MyChart, go to [email or web address].    Your next appointment:  As needed    The format for your next appointment:   In Person  Provider:   Robert Krasowski, MD    Other Instructions   Important Information About Sugar       

## 2022-01-11 ENCOUNTER — Other Ambulatory Visit: Payer: Self-pay | Admitting: Neurology

## 2022-01-11 DIAGNOSIS — G20 Parkinson's disease: Secondary | ICD-10-CM

## 2022-01-11 NOTE — Progress Notes (Signed)
Assessment/Plan:    1.  Parkinson's disease, diagnosed 2014             -Continue carbidopa/levodopa 25/100, 2 tablets at 4 AM, 1 at 9 AM and 1 at 4 PM.  Can trial 1/2 prn anxiety to see if helps.  -add carbidopa/levodopa 50/200 at bedtime             -For now, she will continue pramipexole 0.5 mg 3 times per day.               -Continue amantadine, 100 mg, 1 tablet with first 2 dosages of levodopa  -consider inbrija in future.               -I talked to the patient about the logistics associated with DBS therapy.  We talked about this last visit as well as this visit.  I talked to the patient about risks/benefits/side effects of DBS therapy.  We talked about risks which included but were not limited to infection, paralysis, intraoperative seizure, death, stroke, bleeding around the electrode.   I talked to patient about fiducial placement 1 week prior to DBS therapy.  I talked to the patient about what to expect in the operating room, including the fact that this is an awake surgery.  We talked about battery placement as well as which is done under general anesthesia, generally approximately one week following the initial surgery.  We also talked about the fact that the patient will need to be off of medications for surgery.  The patient and family were given the opportunity to ask questions, which they did, and I answered them to the best of my ability today.  She is going to talk to her family further about this.  We did discuss work-up related to this including neurocognitive testing and levodopa challenge test.  2.  GAD  -pcp managing  -sertraline just increased to 125 mg daily  -on klonopin   Subjective:   Jennifer Fletcher was seen today in follow up for Parkinsons disease.  My previous records were reviewed prior to todays visit as well as outside records available to me.  Son with pt who supplements hx.  Amantadine was added last visit for dyskinesia.  She reports that she is doing  better and not using WC much.  She still has some dyskinesia on the R but it isn't as bad.   I have reviewed records from primary care physician.  Her sertraline was just increased from 50 mg to 100 mg on September 5 and she reports it was increased again recently to 125 mg.  She reports still feeling anxiety.    Current prescribed movement disorder medications: carbidopa/levodopa 25/100, 2 tablets at 4 AM, 1 tablet at 9 AM, 1 tablet at 4 PM (occasional 1 additional) - she is usually taking this at 8pm - which is close to bedtime Pramipexole 0.5 mg at bedtime at 4am/noon/6pm Amantadine, 100 mg twice per day (started last visit for dyskinesia) Gabapentin, 300 mg 3 times daily (by primary care for sciatica) Sertraline, 100 mg daily (by primary care) Clonazepam 0.5 mg at bedtime (prescribed by primary care)  Prior medications: Carbidopa/levodopa 50/200 (was taking it during the day and we switched her to immediate release)  ALLERGIES:   Allergies  Allergen Reactions   Codeine Nausea And Vomiting    CURRENT MEDICATIONS:  No outpatient medications have been marked as taking for the 01/17/22 encounter (Office Visit) with Dyquan Minks, Eustace Quail, DO.  Objective:   PHYSICAL EXAMINATION:    VITALS:   Vitals:   01/17/22 0840  BP: 115/62  Pulse: 62  SpO2: 96%  Weight: 227 lb 12.8 oz (103.3 kg)  Height: 5\' 7"  (1.702 m)     GEN:  The patient appears stated age and is in NAD. HEENT:  Normocephalic, atraumatic.  The mucous membranes are moist. The superficial temporal arteries are without ropiness or tenderness.   Neurological examination:  Orientation: The patient is alert and oriented x3. Cranial nerves: There is good facial symmetry with facial hypomimia. The speech is fluent and clear. Soft palate rises symmetrically and there is no tongue deviation. Hearing is intact to conversational tone. Sensation: Sensation is intact to light touch throughout Motor: Strength is at least antigravity  x4.  Movement examination: Tone: There is nl tone in the bilateral upper extremities.  The tone in the lower extremities is nl.  Abnormal movements: there is mod axial and LLE dyskinesia (took about 45 min ago) Coordination:  There is min decremation with RAM's, only with action of turning in light bulb on the right and finger taps on the right Gait and Station: The patient pushes off to arise.  The patient's stride length is normal (improved)  I have reviewed and interpreted the following labs independently    Chemistry      Component Value Date/Time   NA 139 05/07/2021 0956   K 3.8 05/07/2021 0956   CL 102 05/07/2021 0956   CO2 26 05/07/2021 0956   BUN 20 05/07/2021 0956   CREATININE 0.83 05/07/2021 0956      Component Value Date/Time   CALCIUM 9.3 05/07/2021 0956   AST 13 08/12/2021 0830   ALT 6 08/12/2021 0830       Lab Results  Component Value Date   WBC 9.1 05/07/2021   HGB 13.6 05/07/2021   HCT 44.2 05/07/2021   MCV 100.5 (H) 05/07/2021   PLT 243 05/07/2021    No results found for: "TSH"   Total time spent on today's visit was 31 minutes, including both face-to-face time and nonface-to-face time.  Time included that spent on review of records (prior notes available to me/labs/imaging if pertinent), discussing treatment and goals, answering patient's questions and coordinating care.  Cc:  05/09/2021, PA-C

## 2022-01-17 ENCOUNTER — Encounter: Payer: Self-pay | Admitting: Neurology

## 2022-01-17 ENCOUNTER — Ambulatory Visit (INDEPENDENT_AMBULATORY_CARE_PROVIDER_SITE_OTHER): Payer: Medicare Other | Admitting: Neurology

## 2022-01-17 VITALS — BP 115/62 | HR 62 | Ht 67.0 in | Wt 227.8 lb

## 2022-01-17 DIAGNOSIS — G20B2 Parkinson's disease with dyskinesia, with fluctuations: Secondary | ICD-10-CM | POA: Diagnosis not present

## 2022-01-17 MED ORDER — CARBIDOPA-LEVODOPA ER 50-200 MG PO TBCR: 1.0000 | EXTENDED_RELEASE_TABLET | Freq: Every day | ORAL | 1 refills | Status: DC

## 2022-01-17 NOTE — Patient Instructions (Signed)
Add carbidopa/levodopa 50/200 CR at bedtime If you have anxiety, you can try splitting carbidopa/levodopa 25/100 in half and chewing it or dissolving in ginger ale.  If it helps, you will know the anxiety is a Parkinsons Disease problem.  If not, you will know its purely an anxiety problem.  Local and Online Resources for Power over Parkinson's Group September 2023  LOCAL [place] PARKINSON'S GROUPS  Power over Parkinson's Group:   Power Over Parkinson's Patient Education Group will be Wednesday, September 13th-*Hybrid meting*- in person at [facility] location and via [facility] at 2 pm.   Upcoming [employer] over [employer] Meetings:  2nd Wednesdays of the month at 2 pm:  September 13th, October 11th, November 8th Contact Amy Marriott at [email or web address] if interested in participating in this group Parkinson's Care Partners Group:    3rd Mondays, Contact Misty Paladino Atypical Parkinsonian Patient Group:   4th Wednesdays, [place] If you are interested in participating in these groups with Misty, please contact her directly for how to join those meetings.  Her contact information is [email or web address].    LOCAL EVENTS AND NEW OFFERINGS New PWR! Moves Dynegy Instructor-Led Classes offering at [employer]!  Wednesdays 1-2 pm.   Contact Vonna Kotyk at  [email or web address] or Caron Presume at [place], [email or web address] Dance for Parkinson 's classes will be on Tuesdays 9:30am-10:30am starting October 3-December 12 with a break the week of November 21 . Located in the December 04 which is in the first floor of the [employer]  ([place] for Parkinson's will be held on 2nd and 4th Mondays at 11:00am . First class will start  September 25th.  Located at the [place] ([place].) Through support from the [place] and Drumming for Parkinson's classes are free for both patients and caregivers.  Contact Misty Taylor-Paladino for more details about registering.  [place]:  www.[email or web address] PD Health at Home continues:  Mindfulness Mondays, Wellness Wednesdays, Fitness Fridays  Upcoming Education:   Navigating Nutrition with PD.  Wednesday, Sept. 6th 1:00-2:00 pm Understanding Mind and Memory.  Wednesday, Sept. 20th 1:00-2:00 pm  Expert Briefing:    Parkinson's Disease and the Bladder.  Wednesday, Sept. 13th 1:00-2:00 pm Parkinson's and the Gut-Brain Connection.  Wednesday, Oct. 11th 1:00-2:00 pm Register for expert briefings (webinars) at 10-02-1975 Please check out their website to sign up for emails and see their full online offerings   [place]:  [email or web address]  Third Thursday Webinars:  On the third Thursday of every month at 12 p.m. ET, join our free live webinars to learn about various aspects of living with Parkinson's disease and our work to speed medical breakthroughs. Upcoming Webinar:  Stay tuned Check out additional information on their website to see their full online offerings  Tuesday:  [email or web address] Upcoming Webinar:   Stay tuned Webinar Series:  Living with Parkinson's Meetup.   Third Thursdays each month, 3 pm Care Partner Monthly Meetup.  With 06-01-1985 [place].  First Tuesday of each month, 2 pm Check out additional information to Live Well Today on their website  Parkinson and Movement Disorders (PMD) Alliance:  www.pmdalliance.org NeuroLife Online:  Online Education Events Sign up for emails, which are sent weekly to give you updates on programming and online offerings  Parkinson's Association of the [place]:  www.[email or web address] Information on online support groups,  education events, and online exercises  including Yoga, Parkinson's exercises and more-LOTS of information on links to PD resources and online events Virtual Support Group through Parkinson's Association of the [place]; next one is scheduled for Wednesday, October 4th at 2 pm. (No September meeting due to the symposium.  These are typically scheduled for the 1st Wednesday of the month at 2 pm).  Visit website for details. Register for "Caring for Parkinson's-Caring for You", 9th Annual Symposium.  In-person event in [place].  September 9th.  To register:  [email or web address]/symposium-registration/?blm_aid=[email or web address] MOVEMENT AND EXERCISE OPPORTUNITIES PWR! Moves Classes at [facility] Exercise Room.  Wednesdays 10 and 11 am.   Contact Amy Marriott, PT [email or web address] if interested. NEW PWR! Moves Class offerings at [employer].  Wednesdays 1-2 pm.  Contact Synetta Shadow at  [email or web address] or Aldona Lento at [place],  [email or web address] Parkinson's Wellness Recovery (PWR! Moves)  www.[email or web address] Info on the PWR! Virtual Experience:  You will have access to our expertise through self-assessment, guided plans that start with the PD-specific fundamentals, educational content, tips, Q&A with an expert, and a growing [employer] of PD-specific pre-recorded and live exercise classes of varying types and intensity - both physical and cognitive! If that is not enough, we offer 1:1 wellness consultations (in-person or virtual) to personalize your PWR! Engineering geologist.  Parkinson Dance movement psychotherapist Fridays:  As part of the PD Health @ Home program, this free video series focuses each week on one aspect of fitness designed to support people living with Parkinson's.  These weekly videos highlight the Parkinson Foundation recent fitness guidelines for people with Parkinson's disease. 09-04-1989 Dance for PD website  is offering free, live-stream classes throughout the week, as well as links to [email or web address].[email or web address].br of classes:  [email or web address]/ Virtual dance and Pilates for Parkinson's classes: Click on the Community Tab> Parkinson's Movement Initiative Tab.  To register for classes and for more information, visit www.[email or web address] and click the "community" tab.  [employer] Parkinson's Cycling Classes  [employer]:  Thursdays @ Noon-Live classes at 09-17-1979 ([employer] at [email or web address] or [phone]) [employer]: Virtual Classes Mondays and Thursdays 09-17-1979 classes Tuesday, Wednesday and Thursday (contact [place] at [email or web address]  or ([phone]) [facility] Boxing Varied levels of classes are offered Tuesdays and Thursdays at [facility].  Stretching with [facility] weekly class is also offered for people with Parkinson's To observe a class or for more information, call [phone] or email [phone] at [email or web address] ADDITIONAL SUPPORT AND RESOURCES Well[employer] [employer]:Online Caregiver Education Opportunities:  www.well-springsolutions.org/caregiver-education/caregiver-support-group.  You may also contact Patricia Nettle at [email or web address] or [phone].    Coping with Difficult Caregiver Emotions.  Wednesday, September 20th, 10:30 am-12.  The [facility] Navigating the Maze of Senior Care Options.  Thursday, September 28th, 4-5:15 pm.  The [facility]. Well-Spring Navigator:  04-[birth year]-2001 program, a free service to help individuals and families through the journey of determining care for older adults.  The "Navigator" is a [facility] [employer], who will speak with a prospective client and/or loved ones to provide an assessment of the situation and a set of recommendations for a personalized care plan -- all free of charge, and whether Well-Spring  Solutions offers the needed service or not. If the need is not a service we provide, we are well-connected with reputable programs in town that we can refer you to.  www.well-springsolutions.org or to speak with the Navigator, call [phone].

## 2022-02-14 ENCOUNTER — Encounter: Payer: Self-pay | Admitting: Neurology

## 2022-02-16 ENCOUNTER — Telehealth: Payer: Self-pay | Admitting: Anesthesiology

## 2022-02-16 ENCOUNTER — Other Ambulatory Visit: Payer: Self-pay

## 2022-02-16 MED ORDER — GABAPENTIN 300 MG PO CAPS: 300.0000 mg | ORAL_CAPSULE | Freq: Three times a day (TID) | ORAL | 0 refills | Status: AC

## 2022-02-16 NOTE — Telephone Encounter (Signed)
Patient called requesting a refill      1. Which medications need refilled? (List name and dosage, if known) Gabapentin 300 mg tid   2. Which pharmacy/location is medication to be sent to?   [facility] in [place].  3. Do they need a 30 day or 90 day supply? Patient would like the 90 day

## 2022-02-16 NOTE — Telephone Encounter (Signed)
Prescription sent patient called and notified

## 2022-03-22 ENCOUNTER — Other Ambulatory Visit: Payer: Self-pay | Admitting: Neurology

## 2022-03-23 NOTE — Telephone Encounter (Signed)
I found this in your notes and wanted to verify the PCP prescribes this patients Gabapentin Gabapentin, 300 mg 3 times daily (by primary care for sciatica)

## 2022-04-10 ENCOUNTER — Encounter: Payer: Self-pay | Admitting: Neurology

## 2022-04-19 ENCOUNTER — Other Ambulatory Visit: Payer: Self-pay | Admitting: Neurology

## 2022-04-19 DIAGNOSIS — G20B1 Parkinson's disease with dyskinesia, without mention of fluctuations: Secondary | ICD-10-CM

## 2022-04-19 DIAGNOSIS — G20A1 Parkinson's disease without dyskinesia, without mention of fluctuations: Secondary | ICD-10-CM

## 2022-04-20 ENCOUNTER — Other Ambulatory Visit: Payer: Self-pay | Admitting: *Deleted

## 2022-04-20 MED ORDER — MONTELUKAST SODIUM 10 MG PO TABS: 10.0000 mg | ORAL_TABLET | Freq: Every day | ORAL | 0 refills | Status: AC

## 2022-05-03 ENCOUNTER — Other Ambulatory Visit: Payer: Self-pay

## 2022-05-03 DIAGNOSIS — G20B1 Parkinson's disease with dyskinesia, without mention of fluctuations: Secondary | ICD-10-CM

## 2022-05-03 DIAGNOSIS — G20A1 Parkinson's disease without dyskinesia, without mention of fluctuations: Secondary | ICD-10-CM

## 2022-05-03 MED ORDER — AMANTADINE HCL 100 MG PO CAPS: ORAL_CAPSULE | ORAL | 0 refills | Status: DC

## 2022-05-03 MED ORDER — CARBIDOPA-LEVODOPA 25-100 MG PO TABS: ORAL_TABLET | ORAL | 0 refills | Status: DC

## 2022-05-03 MED ORDER — PRAMIPEXOLE DIHYDROCHLORIDE 0.5 MG PO TABS: 0.5000 mg | ORAL_TABLET | Freq: Three times a day (TID) | ORAL | 0 refills | Status: DC

## 2022-05-09 ENCOUNTER — Other Ambulatory Visit: Payer: Self-pay

## 2022-05-09 ENCOUNTER — Telehealth: Payer: Self-pay | Admitting: Neurology

## 2022-05-09 NOTE — Telephone Encounter (Signed)
Pt was seen by Filomena Jungling (notes in [place]) today. They left a message. The pt is declining quickly over the last 3 weeks. Decreased strength in lower extremities. Pt needs more assistance with ADLs. She needs adjustment of meds or to be seen sooner than April appointment.

## 2022-05-09 NOTE — Telephone Encounter (Signed)
Called and gave Dr. Arturo Morton recommendations

## 2022-05-17 ENCOUNTER — Other Ambulatory Visit: Payer: Self-pay | Admitting: Allergy and Immunology

## 2022-06-14 ENCOUNTER — Telehealth: Payer: Self-pay

## 2022-06-14 NOTE — Telephone Encounter (Signed)
[facility]ed [facility] and let them know that we do not prescribe this medication or the  Celecoxib these are prescribed by her PCP

## 2022-06-14 NOTE — Telephone Encounter (Signed)
[facility] called in asking for a new prescription to be called in of sertraline (ZOLOFT) 50 MG tablet.

## 2022-07-17 NOTE — Progress Notes (Unsigned)
Assessment/Plan:    1.  Parkinson's disease, diagnosed 2014             -Increase carbidopa/levodopa 25/100, 2 tablets at 4 AM, 1 at 8am, 1 at noon, and 1 at 4 PM.    -continue carbidopa/levodopa 50/200 at bedtime             -For now, she will continue pramipexole 0.5 mg 3 times per day.               -Continue amantadine, 100 mg, 1 tablet with 8am/noon dose of levodopa  -consider inbrija in future.               -I talked to the patient about the logistics associated with DBS therapy.  We talked about this last visit as well as this visit.  I talked to the patient about risks/benefits/side effects of DBS therapy.  We talked about risks which included but were not limited to infection, paralysis, intraoperative seizure, death, stroke, bleeding around the electrode.   I talked to patient about fiducial placement 1 week prior to DBS therapy.  I talked to the patient about what to expect in the operating room, including the fact that this is an awake surgery.  We talked about battery placement as well as which is done under general anesthesia, generally approximately one week following the initial surgery.  We also talked about the fact that the patient will need to be off of medications for surgery.  The patient and family were given the opportunity to ask questions, which they did, and I answered them to the best of my ability today.  She is going to talk to her family further about this.  We did discuss work-up related to this including neurocognitive testing and levodopa challenge test.  -we will try to find PT (had [facility] last time)  2.  GAD  -pcp managing  -on sertraline, 125 mg daily  -on klonopin   Subjective:   Jennifer Fletcher was seen today in follow up for Parkinsons disease.  My previous records were reviewed prior to todays visit as well as outside records available to me.  daughter with pt who supplements hx..  last visit I added bedtime levodopa last visit.  She emailed and  stated that she thought it was causing her to be able to not move at night.  I was out of the office and one of my partners told her to stop it for a few nights to see if those symptoms resolved.  They did not, and she emailed back 6 days later and stated that she was "struggling to move."  We discussed with her that it was obviously not the medication causing that issue, but certainly could be Parkinson's.  She ended up going back on it per her today.  She went to her primary PA and they noted that patient was having neck, low back and hip pain.  The PA wrote in her note "I think she needs closer follow-up with her neurologist regarding symptoms or consideration for pain management referral."  Unfortunately, I told her I really do not deal with pain management or these type of pains and if her primary PA thought she needed a pain management physician, then they would need to refer her.  She is taking Aleve for that.  Its not helping.  She is not falling but has had near falls.  She has not been exercising - was doing floor exercises but had  trouble getting off of the floor so stopped.  When asked about hallucinations, she describes illusions - mailboxes that look like people.    Current prescribed movement disorder medications: carbidopa/levodopa 25/100, 2 tablets at 4 AM, 1 tablet at 9 AM, 1 tablet at 4 PM (occasional 1 additional)  Carbidopa/levodopa 50/200 CR at bedtime (added last visit) Pramipexole 0.5 mg at bedtime at 4am/noon/6pm Amantadine, 100 mg twice per day  Gabapentin, 300 mg 3 times daily  Sertraline, 100 mg daily  Clonazepam 0.5 mg at bedtime (prescribed by primary care)  Prior medications: Carbidopa/levodopa 50/200 (was taking it during the day and we switched her to immediate release)  ALLERGIES:   Allergies  Allergen Reactions   Codeine Nausea And Vomiting    CURRENT MEDICATIONS:  Current Meds  Medication Sig   albuterol (PROVENTIL) (2.5 MG/3ML) 0.083% nebulizer solution Take  3 mLs (2.5 mg total) by nebulization every 6 (six) hours as needed for wheezing or shortness of breath.   albuterol (VENTOLIN HFA) 108 (90 Base) MCG/ACT inhaler Inhale 2 puffs into the lungs every 6 (six) hours as needed for wheezing or shortness of breath.   amantadine (SYMMETREL) 100 MG capsule TAKE 1 CAPSULE BY MOUTH 2 TIMES DAILY. TAKE WITH FIRST TWO DOSES OF LEVODOPA   aspirin 81 MG chewable tablet Chew 81 mg by mouth daily.   Azelastine HCl 0.15 % SOLN Apply 2 sprays each nostril twice a day as needed for nasal drainage or throat clearing (Patient taking differently: Place 2 sprays into both nostrils 2 (two) times daily as needed (nasal drainage or throat clearing). Apply 2 sprays each nostril twice a day as needed for nasal drainage or throat clearing)   busPIRone (BUSPAR) 5 MG tablet Take by mouth.   carbidopa-levodopa (SINEMET IR) 25-100 MG tablet TAKE 2 TABLETS IN MORNING, 4- 6 TABLETS THROUGHOUT THE DAY WITH LAST TABLET NO LATER THAN 4PM   Cholecalciferol (D3 PO) Take 1 tablet by mouth daily.   clonazePAM (KLONOPIN) 0.5 MG tablet Take 0.5 mg by mouth daily.   diclofenac (VOLTAREN) 75 MG EC tablet Take 75 mg by mouth 2 (two) times daily.   fexofenadine (ALLEGRA) 180 MG tablet Take 1 tablet (180 mg total) by mouth daily.   fluticasone (FLONASE) 50 MCG/ACT nasal spray Apply two sprays per nostril daily (AIM FOR EAR ON EACH SIDE) for 1-2 weeks at a time before stopping (Patient taking differently: Place 1 spray into both nostrils daily. Apply two sprays per nostril daily (AIM FOR EAR ON EACH SIDE) for 1-2 weeks at a time before stopping)   gabapentin (NEURONTIN) 300 MG capsule Take 1 capsule (300 mg total) by mouth 3 (three) times daily.   levothyroxine (SYNTHROID) 112 MCG tablet Take 150 mcg by mouth daily before breakfast.   montelukast (SINGULAIR) 10 MG tablet Take 1 tablet (10 mg total) by mouth at bedtime.   pramipexole (MIRAPEX) 0.5 MG tablet Take 1 tablet (0.5 mg total) by mouth 3  (three) times daily.   sertraline (ZOLOFT) 50 MG tablet Take 50 mg by mouth daily.     Objective:   PHYSICAL EXAMINATION:    VITALS:   Vitals:   07/19/22 0941  BP: 120/70  Pulse: 75  SpO2: 98%  Weight: 214 lb 6.4 oz (97.3 kg)  Height: 5\' 7"  (1.702 m)   GEN:  The patient appears stated age and is in NAD. HEENT:  Normocephalic, atraumatic.  The mucous membranes are moist. The superficial temporal arteries are without ropiness or tenderness. CV:  RRR Lungs:  CTAB Neck:  no bruits   Neurological examination:  Orientation: The patient is alert and oriented x3. Cranial nerves: There is good facial symmetry with facial hypomimia. The speech is fluent and clear. Soft palate rises symmetrically and there is no tongue deviation. Hearing is intact to conversational tone. Sensation: Sensation is intact to light touch throughout Motor: Strength is at least antigravity x4.  Movement examination: Tone: There is nl tone in the bilateral upper extremities.  The tone in the lower extremities is nl.  Abnormal movements: there is mild dyskinesia on the L (she is due for medication) Coordination:  There is mild decremation with RAM's, with any form of RAMS, including alternating supination and pronation of the forearm, hand opening and closing, finger taps, heel taps and toe taps bilaterally Gait and Station: The patient pushes off to arise.  The patient's stride length is antalgic and she is a bit turned to the right.  I have reviewed and interpreted the following labs independently    Chemistry      Component Value Date/Time   NA 139 05/07/2021 0956   K 3.8 05/07/2021 0956   CL 102 05/07/2021 0956   CO2 26 05/07/2021 0956   BUN 20 05/07/2021 0956   CREATININE 0.83 05/07/2021 0956      Component Value Date/Time   CALCIUM 9.3 05/07/2021 0956   AST 13 08/12/2021 0830   ALT 6 08/12/2021 0830       Lab Results  Component Value Date   WBC 9.1 05/07/2021   HGB 13.6 05/07/2021    HCT 44.2 05/07/2021   MCV 100.5 (H) 05/07/2021   PLT 243 05/07/2021    No results found for: "TSH"   Total time spent on today's visit was 33 minutes, including both face-to-face time and nonface-to-face time.  Time included that spent on review of records (prior notes available to me/labs/imaging if pertinent), discussing treatment and goals, answering patient's questions and coordinating care.  Cc:  Annabell Sabal, PA-C

## 2022-07-19 ENCOUNTER — Ambulatory Visit: Payer: Medicare HMO | Admitting: Neurology

## 2022-07-19 ENCOUNTER — Encounter: Payer: Self-pay | Admitting: Neurology

## 2022-07-19 VITALS — BP 120/70 | HR 75 | Ht 67.0 in | Wt 214.4 lb

## 2022-07-19 DIAGNOSIS — G20B2 Parkinson's disease with dyskinesia, with fluctuations: Secondary | ICD-10-CM | POA: Diagnosis not present

## 2022-07-19 DIAGNOSIS — G20A1 Parkinson's disease without dyskinesia, without mention of fluctuations: Secondary | ICD-10-CM

## 2022-07-19 DIAGNOSIS — G20B1 Parkinson's disease with dyskinesia, without mention of fluctuations: Secondary | ICD-10-CM

## 2022-07-19 MED ORDER — CARBIDOPA-LEVODOPA ER 50-200 MG PO TBCR: 1.0000 | EXTENDED_RELEASE_TABLET | Freq: Every day | ORAL | 1 refills | Status: DC

## 2022-07-19 MED ORDER — CARBIDOPA-LEVODOPA 25-100 MG PO TABS: ORAL_TABLET | ORAL | 1 refills | Status: DC

## 2022-07-19 MED ORDER — PRAMIPEXOLE DIHYDROCHLORIDE 0.5 MG PO TABS: 0.5000 mg | ORAL_TABLET | Freq: Three times a day (TID) | ORAL | 1 refills | Status: DC

## 2022-07-19 NOTE — Patient Instructions (Addendum)
Increase carbidopa/levodopa 25/100, 2 tablets at 4 AM, 1 at 8am, 1 at noon, and 1 at 4 PM continue carbidopa/levodopa 50/200 at bedtime.  continue pramipexole 0.5 mg 3 times per day.    Continue amantadine with your 4am and noon dose of levodopa  The physicians and staff at [facility] are committed to providing excellent care. You may receive a survey requesting feedback about your experience at our office. We strive to receive "very good" responses to the survey questions. If you feel that your experience would prevent you from giving the office a "very good " response, please contact our office to try to remedy the situation. We may be reached at [phone]. Thank you for taking the time out of your busy day to complete the survey.

## 2023-01-26 ENCOUNTER — Ambulatory Visit: Payer: Medicare HMO | Admitting: Neurology

## 2023-01-31 NOTE — Progress Notes (Signed)
Assessment/Plan:    1.  Parkinson's disease, diagnosed 2014             -continue carbidopa/levodopa 25/100, 2 tablets at 4 AM, 1 at 8am, 1 at noon, and 1 at 4 PM.    -continue carbidopa/levodopa 50/200 at bedtime             -decrease amantadine to 100 mg daily x 1 week and then stop it.  She is having hallucinations from a distance.    -For now, she will continue pramipexole 0.5 mg 3 times per day.  if above doesn't change hallucinations, we will end up weaning this.  Discussed this with her today  -consider inbrija in future.               -likely not dbs candidate now with hallucinations but think that meds may be initiating some of this   2.  GAD  -pcp managing  -on sertraline, 125 mg daily  -on klonopin.  Her dosing was increased to twice per day.  Discussed with the patient that I really would like her not to be on daytime clonazepam due to risk of falls.  If anxiety is not well-controlled, and a daytime benzodiazepine is really not ideal, particularly in this age group.  She is having more hallucinations too.  I would recommend psychiatry involvement if needed.  3.  Chronic low back pain  -On chronic tramadol.  This may be contributing to hallucinations as well.   We will need to watch this closely.  Primary PA is filling this and told her to discuss with prescribing provider  Subjective:   Jennifer Fletcher was seen today in follow up for Parkinsons disease.  My previous records were reviewed prior to todays visit as well as outside records available to me.   We slightly increased her levodopa last visit.  She tolerated that well.  She has had no falls.  No lightheadedness or near syncope. Lasix added due to leg swelling.   She is having some hallucinations when she looks out the window: police cars, kids, et.    She has been doing some physical therapy.  Last note from primary provider for September 20 and those notes are reviewed.  Notes indicate that home health nursing suggested  increasing her clonazepam to twice daily dosing, which the PA did.  Current prescribed movement disorder medications: carbidopa/levodopa 25/100, 2 tablets at 4 AM, 1 at 8am, 1 at noon, and 1 at 4 PM (increased) Carbidopa/levodopa 50/200 CR at bedtime  Pramipexole 0.5 mg at bedtime at 4am/noon/6pm Amantadine, 100 mg twice per day  Gabapentin, 300 mg 3 times daily  Sertraline, 100 mg daily  Clonazepam 0.5 mg twice per day (prescribed by primary care PA)  Prior medications: Carbidopa/levodopa 50/200 (was taking it during the day and we switched her to immediate release)  ALLERGIES:   Allergies  Allergen Reactions   Codeine Nausea And Vomiting    CURRENT MEDICATIONS:  Current Meds  Medication Sig   albuterol (PROVENTIL) (2.5 MG/3ML) 0.083% nebulizer solution Take 3 mLs (2.5 mg total) by nebulization every 6 (six) hours as needed for wheezing or shortness of breath.   albuterol (VENTOLIN HFA) 108 (90 Base) MCG/ACT inhaler Inhale 2 puffs into the lungs every 6 (six) hours as needed for wheezing or shortness of breath.   amantadine (SYMMETREL) 100 MG capsule TAKE 1 CAPSULE BY MOUTH 2 TIMES DAILY. TAKE WITH FIRST TWO DOSES OF LEVODOPA   aspirin 81 MG chewable  tablet Chew 81 mg by mouth daily.   Azelastine HCl 0.15 % SOLN Apply 2 sprays each nostril twice a day as needed for nasal drainage or throat clearing (Patient taking differently: Place 2 sprays into both nostrils 2 (two) times daily as needed (nasal drainage or throat clearing). Apply 2 sprays each nostril twice a day as needed for nasal drainage or throat clearing)   carbidopa-levodopa (SINEMET CR) 50-200 MG tablet Take 1 tablet by mouth at bedtime.   carbidopa-levodopa (SINEMET IR) 25-100 MG tablet 2 tablets at 4 AM, 1 at 8am, 1 at noon, and 1 at 4 PM   Cholecalciferol (D3 PO) Take 1 tablet by mouth daily.   clonazePAM (KLONOPIN) 0.5 MG tablet Take 0.5 mg by mouth 2 (two) times daily.   diclofenac (VOLTAREN) 75 MG EC tablet Take 75 mg  by mouth 2 (two) times daily.   fexofenadine (ALLEGRA) 180 MG tablet Take 1 tablet (180 mg total) by mouth daily.   fluticasone (FLONASE) 50 MCG/ACT nasal spray Apply two sprays per nostril daily (AIM FOR EAR ON EACH SIDE) for 1-2 weeks at a time before stopping (Patient taking differently: Place 1 spray into both nostrils daily. Apply two sprays per nostril daily (AIM FOR EAR ON EACH SIDE) for 1-2 weeks at a time before stopping)   furosemide (LASIX) 20 MG tablet Take 20 mg by mouth daily.   gabapentin (NEURONTIN) 300 MG capsule Take 1 capsule (300 mg total) by mouth 3 (three) times daily.   levothyroxine (SYNTHROID) 112 MCG tablet Take 150 mcg by mouth daily before breakfast.   montelukast (SINGULAIR) 10 MG tablet Take 1 tablet (10 mg total) by mouth at bedtime.   pramipexole (MIRAPEX) 0.5 MG tablet Take 1 tablet (0.5 mg total) by mouth 3 (three) times daily.   sertraline (ZOLOFT) 50 MG tablet Take 50 mg by mouth daily.   traMADol (ULTRAM) 50 MG tablet Take 50 mg by mouth every 6 (six) hours as needed.     Objective:   PHYSICAL EXAMINATION:    VITALS:   Vitals:   02/05/23 0837  BP: 116/72  Pulse: 76  SpO2: 98%  Weight: 226 lb (102.5 kg)  Height: 5\' 7"  (1.702 m)    GEN:  The patient appears stated age and is in NAD. HEENT:  Normocephalic, atraumatic.  The mucous membranes are moist. The superficial temporal arteries are without ropiness or tenderness. CV:  RRR Lungs:  CTAB Neck:  no bruits   Neurological examination:  Orientation: The patient is alert and oriented x3. Cranial nerves: There is good facial symmetry with facial hypomimia. The speech is fluent and clear. Soft palate rises symmetrically and there is no tongue deviation. Hearing is intact to conversational tone. Sensation: Sensation is intact to light touch throughout Motor: Strength is at least antigravity x4.  Movement examination: Tone: There is nl tone in the bilateral upper extremities.  The tone in the  lower extremities is nl.  Abnormal movements: there is no dyskinesia or tremor Coordination:  There is mild decremation with finger taps on the L Gait and Station: The patient pushes off to arise.  She is min 2 person assist.  She is given a walker and walks well with it but she is cautious.    I have reviewed and interpreted the following labs independently    Chemistry      Component Value Date/Time   NA 139 05/07/2021 0956   K 3.8 05/07/2021 0956   CL 102 05/07/2021 0956  CO2 26 05/07/2021 0956   BUN 20 05/07/2021 0956   CREATININE 0.83 05/07/2021 0956      Component Value Date/Time   CALCIUM 9.3 05/07/2021 0956   AST 13 08/12/2021 0830   ALT 6 08/12/2021 0830       Lab Results  Component Value Date   WBC 9.1 05/07/2021   HGB 13.6 05/07/2021   HCT 44.2 05/07/2021   MCV 100.5 (H) 05/07/2021   PLT 243 05/07/2021    No results found for: "TSH"   Total time spent on today's visit was 30 minutes, including both face-to-face time and nonface-to-face time.  Time included that spent on review of records (prior notes available to me/labs/imaging if pertinent), discussing treatment and goals, answering patient's questions and coordinating care.  Cc:  Luanna Salk, PA-C

## 2023-02-05 ENCOUNTER — Ambulatory Visit: Payer: Medicare HMO | Admitting: Neurology

## 2023-02-05 VITALS — BP 116/72 | HR 76 | Ht 67.0 in | Wt 226.0 lb

## 2023-02-05 DIAGNOSIS — G20A1 Parkinson's disease without dyskinesia, without mention of fluctuations: Secondary | ICD-10-CM | POA: Diagnosis not present

## 2023-02-05 DIAGNOSIS — R441 Visual hallucinations: Secondary | ICD-10-CM

## 2023-02-05 MED ORDER — CARBIDOPA-LEVODOPA ER 50-200 MG PO TBCR: 1.0000 | EXTENDED_RELEASE_TABLET | Freq: Every day | ORAL | 1 refills | Status: DC

## 2023-02-05 NOTE — Patient Instructions (Addendum)
Decrease amantadine to ONCE per day for a week and then STOP amantadine.  If that doesn't help with hallucinations, I may need to wean your pramipexole next time  Talk to your other prescribers as the daytime klonopin can increase risk of falls/confusion and the tramadol can cause hallucinations.  The physicians and staff at [facility] are committed to providing excellent care. You may receive a survey requesting feedback about your experience at our office. We strive to receive "very good" responses to the survey questions. If you feel that your experience would prevent you from giving the office a "very good " response, please contact our office to try to remedy the situation. We may be reached at [phone]. Thank you for taking the time out of your busy day to complete the survey.

## 2023-02-21 ENCOUNTER — Other Ambulatory Visit: Payer: Self-pay

## 2023-02-21 DIAGNOSIS — G20A1 Parkinson's disease without dyskinesia, without mention of fluctuations: Secondary | ICD-10-CM

## 2023-02-21 DIAGNOSIS — G20B1 Parkinson's disease with dyskinesia, without mention of fluctuations: Secondary | ICD-10-CM

## 2023-02-21 MED ORDER — PRAMIPEXOLE DIHYDROCHLORIDE 0.5 MG PO TABS: 0.5000 mg | ORAL_TABLET | Freq: Three times a day (TID) | ORAL | 0 refills | Status: DC

## 2023-05-31 ENCOUNTER — Other Ambulatory Visit: Payer: Self-pay

## 2023-05-31 DIAGNOSIS — G20B1 Parkinson's disease with dyskinesia, without mention of fluctuations: Secondary | ICD-10-CM

## 2023-05-31 DIAGNOSIS — G20A1 Parkinson's disease without dyskinesia, without mention of fluctuations: Secondary | ICD-10-CM

## 2023-05-31 MED ORDER — PRAMIPEXOLE DIHYDROCHLORIDE 0.5 MG PO TABS: 0.5000 mg | ORAL_TABLET | Freq: Three times a day (TID) | ORAL | 0 refills | Status: DC

## 2023-06-01 ENCOUNTER — Other Ambulatory Visit: Payer: Self-pay

## 2023-06-01 DIAGNOSIS — G20B1 Parkinson's disease with dyskinesia, without mention of fluctuations: Secondary | ICD-10-CM

## 2023-06-01 MED ORDER — PRAMIPEXOLE DIHYDROCHLORIDE 0.5 MG PO TABS: 0.5000 mg | ORAL_TABLET | Freq: Three times a day (TID) | ORAL | 0 refills | Status: DC

## 2023-07-24 ENCOUNTER — Other Ambulatory Visit: Payer: Self-pay

## 2023-07-24 DIAGNOSIS — G20A1 Parkinson's disease without dyskinesia, without mention of fluctuations: Secondary | ICD-10-CM

## 2023-07-24 MED ORDER — CARBIDOPA-LEVODOPA ER 50-200 MG PO TBCR: 1.0000 | EXTENDED_RELEASE_TABLET | Freq: Every day | ORAL | 1 refills | Status: DC

## 2023-08-01 ENCOUNTER — Other Ambulatory Visit: Payer: Self-pay

## 2023-08-01 DIAGNOSIS — G20B1 Parkinson's disease with dyskinesia, without mention of fluctuations: Secondary | ICD-10-CM

## 2023-08-01 DIAGNOSIS — G20A1 Parkinson's disease without dyskinesia, without mention of fluctuations: Secondary | ICD-10-CM

## 2023-08-01 MED ORDER — CARBIDOPA-LEVODOPA 25-100 MG PO TABS
ORAL_TABLET | ORAL | 0 refills | Status: DC
Start: 2023-08-01 — End: 2023-12-20

## 2023-08-06 NOTE — Progress Notes (Signed)
 Assessment/Plan:    1.  Parkinson's disease, diagnosed 2014             -continue carbidopa /levodopa  25/100, 2 tablets at 4 AM, 1 at 8am, 1 at noon, and 1 at 4 PM.    -continue carbidopa /levodopa  50/200 at bedtime  -continue pramipexole  0.5 mg 3 times per day.  -refer for home PT -they asked about hospital beds.  I told her insurance will likely not pay.   -discussed importance of exercises  -consider inbrija  in future.    2.  GAD  -pcp managing  -on sertraline, 125 mg daily  -on klonopin. Now on bid dosing. Discussed with the patient that I really would like her not to be on daytime clonazepam due to risk of falls.  If anxiety is not well-controlled, and a daytime benzodiazepine is really not ideal, particularly in this age group. Her son wasn't her previously and said he understood and was going to address it at pcp office.     3.  Chronic low back pain  -On chronic tramadol.  This may be contributing to hallucinations as well.   We will need to watch this closely.  Primary PA is filling this and told her to discuss with prescribing provider.  This was discussed the last few visits.  Gabapentin , 400 mg 3 times per day may be playing a role as well.  I would rather have her address these medications first before I start backing down on the Parkinson's medicines, but certainly backing down on the pramipexole  could be valuable as well.  4.  Apraxia of eyelid openeing  -I didn't see it today but she describes it.  Discussed botox but she decided to hold it for now.  I agree with that.  Subjective:   Jennifer Fletcher was seen today in follow up for Parkinsons disease.  My previous records were reviewed prior to todays visit as well as outside records available to me.   Daughter and son in law supplement hx.  We stopped her amantadine  last visit because she was having some hallucinations at a distance.  She reports today that they have gotten better and perhaps has a hallucination one time  per week; she will see people working or a dump truck out the window or police cars out the window.  I did note in her primary care notes that she was complaining about some hallucinations.  She does yell out in the sleep and awoke and thought that the "law was coming to get her."   We had talked previously about her tramadol contributing that she is on continuous tramadol by primary care, number 60/month in addition to #90 gabapentin , 400 mg 3 times per day.  Last fall was about a month ago - was outside without the walker and fell in the carport.  This was the only fall.  They report hip pain is keeping her up at night.  She has some trouble opening the eyes.    Current prescribed movement disorder medications: carbidopa /levodopa  25/100, 2 tablets at 4 AM, 1 at 8am, 1 at noon, and 1 at 4 PM (increased) Carbidopa /levodopa  50/200 CR at bedtime  Pramipexole  0.5 mg at bedtime at 4am/noon/6pm Gabapentin , 400 mg 3 times daily (increased by pcp) Sertraline, 100 mg daily  Clonazepam 0.5 mg twice per day (prescribed by primary care PA)  Prior medications: Carbidopa /levodopa  50/200 (was taking it during the day and we switched her to immediate release); amantadine  (stopped when she was having some  hallucinations at a distance)  ALLERGIES:   Allergies  Allergen Reactions   Codeine Nausea And Vomiting    CURRENT MEDICATIONS:  Current Meds  Medication Sig   albuterol  (PROVENTIL ) (2.5 MG/3ML) 0.083% nebulizer solution Take 3 mLs (2.5 mg total) by nebulization every 6 (six) hours as needed for wheezing or shortness of breath.   albuterol  (VENTOLIN  HFA) 108 (90 Base) MCG/ACT inhaler Inhale 2 puffs into the lungs every 6 (six) hours as needed for wheezing or shortness of breath.   aspirin 81 MG chewable tablet Chew 81 mg by mouth daily.   Azelastine  HCl 0.15 % SOLN Apply 2 sprays each nostril twice a day as needed for nasal drainage or throat clearing (Patient taking differently: Place 2 sprays into both  nostrils 2 (two) times daily as needed (nasal drainage or throat clearing). Apply 2 sprays each nostril twice a day as needed for nasal drainage or throat clearing)   carbidopa -levodopa  (SINEMET  CR) 50-200 MG tablet Take 1 tablet by mouth at bedtime.   carbidopa -levodopa  (SINEMET  IR) 25-100 MG tablet 2 tablets at 4 AM, 1 at 8am, 1 at noon, and 1 at 4 PM   Cholecalciferol (D3 PO) Take 1 tablet by mouth daily.   clonazePAM (KLONOPIN) 0.5 MG tablet Take 0.5 mg by mouth 2 (two) times daily.   diclofenac (VOLTAREN) 75 MG EC tablet Take 75 mg by mouth 2 (two) times daily.   fexofenadine  (ALLEGRA ) 180 MG tablet Take 1 tablet (180 mg total) by mouth daily.   fluticasone  (FLONASE ) 50 MCG/ACT nasal spray Apply two sprays per nostril daily (AIM FOR EAR ON EACH SIDE) for 1-2 weeks at a time before stopping (Patient taking differently: Place 1 spray into both nostrils daily. Apply two sprays per nostril daily (AIM FOR EAR ON EACH SIDE) for 1-2 weeks at a time before stopping)   furosemide (LASIX) 20 MG tablet Take 20 mg by mouth daily.   gabapentin  (NEURONTIN ) 300 MG capsule Take 1 capsule (300 mg total) by mouth 3 (three) times daily.   levothyroxine (SYNTHROID) 112 MCG tablet Take 150 mcg by mouth daily before breakfast.   montelukast  (SINGULAIR ) 10 MG tablet Take 1 tablet (10 mg total) by mouth at bedtime.   pramipexole  (MIRAPEX ) 0.5 MG tablet Take 1 tablet (0.5 mg total) by mouth 3 (three) times daily.   sertraline (ZOLOFT) 50 MG tablet Take 50 mg by mouth daily.   traMADol (ULTRAM) 50 MG tablet Take 50 mg by mouth every 6 (six) hours as needed.     Objective:   PHYSICAL EXAMINATION:    VITALS:   Vitals:   08/07/23 0902  BP: 118/70  Pulse: 80  SpO2: 98%  Weight: 236 lb 9.6 oz (107.3 kg)  Height: 5\' 7"  (1.702 m)     GEN:  The patient appears stated age and is in NAD. HEENT:  Normocephalic, atraumatic.  The mucous membranes are moist. The superficial temporal arteries are without ropiness or  tenderness. CV:  RRR Lungs:  CTAB Neck:  no bruits   Neurological examination:  Orientation: The patient is alert and oriented x3. Cranial nerves: There is good facial symmetry with facial hypomimia. The speech is fluent and clear. Soft palate rises symmetrically and there is no tongue deviation. Hearing is intact to conversational tone. Sensation: Sensation is intact to light touch throughout Motor: Strength is at least antigravity x4.  Movement examination: Tone: There is nl tone in the bilateral upper extremities.  The tone in the lower extremities is nl.  Abnormal movements: there is mild R leg and head dyskinesia (and she reports its time for medication) Coordination:  There is mild decremation with finger taps bilaterally Gait and Station: The patient pushes off to arise.  She pushes off to arise.   She is given a walker and drags the legs, L>R    I have reviewed and interpreted the following labs independently    Chemistry      Component Value Date/Time   NA 139 05/07/2021 0956   K 3.8 05/07/2021 0956   CL 102 05/07/2021 0956   CO2 26 05/07/2021 0956   BUN 20 05/07/2021 0956   CREATININE 0.83 05/07/2021 0956      Component Value Date/Time   CALCIUM  9.3 05/07/2021 0956   AST 13 08/12/2021 0830   ALT 6 08/12/2021 0830       Lab Results  Component Value Date   WBC 9.1 05/07/2021   HGB 13.6 05/07/2021   HCT 44.2 05/07/2021   MCV 100.5 (H) 05/07/2021   PLT 243 05/07/2021    No results found for: "TSH"   Total time spent on today's visit was 30 minutes, including both face-to-face time and nonface-to-face time.  Time included that spent on review of records (prior notes available to me/labs/imaging if pertinent), discussing treatment and goals, answering patient's questions and coordinating care.  Cc:  Etha Henle, PA-C

## 2023-08-07 ENCOUNTER — Encounter: Payer: Self-pay | Admitting: Neurology

## 2023-08-07 ENCOUNTER — Ambulatory Visit: Payer: Medicare HMO | Admitting: Neurology

## 2023-08-07 ENCOUNTER — Other Ambulatory Visit: Payer: Self-pay

## 2023-08-07 VITALS — BP 118/70 | HR 80 | Ht 67.0 in | Wt 236.6 lb

## 2023-08-07 DIAGNOSIS — R2689 Other abnormalities of gait and mobility: Secondary | ICD-10-CM

## 2023-08-07 DIAGNOSIS — R482 Apraxia: Secondary | ICD-10-CM

## 2023-08-07 DIAGNOSIS — G20B2 Parkinson's disease with dyskinesia, with fluctuations: Secondary | ICD-10-CM

## 2023-08-07 DIAGNOSIS — R441 Visual hallucinations: Secondary | ICD-10-CM | POA: Diagnosis not present

## 2023-08-07 NOTE — Patient Instructions (Signed)
 You may be able to get a free electric bed via nonprofit medical company called [employer].  They have all kinds of medical equipment including wheelchairs, walkers, canes and incontinence materials, which is free of charge.  Their phone number is [phone].  Their email is [email or web address].  Feel free to reach out to them with questions.  I believe that they are only open for pick up on Tuesdays but you can contact them other days of the week.

## 2023-08-15 ENCOUNTER — Telehealth: Payer: Self-pay | Admitting: Neurology

## 2023-08-15 NOTE — Telephone Encounter (Signed)
 Left message with after hour service on 4-.30-25 at 12:37 pm   Caller states would like to request a verbal oder  for her UTI assessment

## 2023-08-15 NOTE — Telephone Encounter (Signed)
 Called [facility] and we are unable to do a verbal order for UTI assessment due to the fact that we are unable to treat an infection. Left message to call PCP for that order please

## 2023-12-20 ENCOUNTER — Telehealth: Payer: Self-pay | Admitting: Neurology

## 2023-12-20 ENCOUNTER — Other Ambulatory Visit: Payer: Self-pay

## 2023-12-20 DIAGNOSIS — G20A1 Parkinson's disease without dyskinesia, without mention of fluctuations: Secondary | ICD-10-CM

## 2023-12-20 DIAGNOSIS — G20B1 Parkinson's disease with dyskinesia, without mention of fluctuations: Secondary | ICD-10-CM

## 2023-12-20 MED ORDER — CARBIDOPA-LEVODOPA ER 50-200 MG PO TBCR: 1.0000 | EXTENDED_RELEASE_TABLET | Freq: Every day | ORAL | 0 refills | Status: DC

## 2023-12-20 MED ORDER — CARBIDOPA-LEVODOPA 25-100 MG PO TABS: ORAL_TABLET | ORAL | 0 refills | Status: DC

## 2023-12-20 NOTE — Telephone Encounter (Signed)
 Phone call will not go through

## 2023-12-20 NOTE — Telephone Encounter (Signed)
 Refills sent in called [facility] and called daughter and left voicemail.

## 2023-12-20 NOTE — Telephone Encounter (Signed)
 Caller states the pt is out of her medication and the pharmacy states she needs to contact her doctor. Medication is for parkinsons.

## 2023-12-25 ENCOUNTER — Telehealth: Payer: Self-pay | Admitting: Neurology

## 2023-12-25 NOTE — Telephone Encounter (Signed)
 Pt.s daughter calling again for refill [facility] [place], [place] - [address]  both Rx's completed on 9/4 but pharmacy says they do not have   Rx-carbidopa -levodopa  (SINEMET  CR) 50-200 MG tablet  Rx-carbidopa -levodopa  (SINEMET  IR) 25-100 MG tablet

## 2023-12-25 NOTE — Telephone Encounter (Signed)
 Pt  needs a refill on her pramipexole  0.5mg   She takes 3 pills daily  [facility]

## 2023-12-26 ENCOUNTER — Other Ambulatory Visit: Payer: Self-pay

## 2023-12-26 DIAGNOSIS — G20B1 Parkinson's disease with dyskinesia, without mention of fluctuations: Secondary | ICD-10-CM

## 2023-12-26 MED ORDER — PRAMIPEXOLE DIHYDROCHLORIDE 0.5 MG PO TABS: 0.5000 mg | ORAL_TABLET | Freq: Three times a day (TID) | ORAL | 0 refills | Status: DC

## 2023-12-26 NOTE — Telephone Encounter (Signed)
Sent perscription

## 2024-02-18 NOTE — Progress Notes (Unsigned)
 Assessment/Plan:    1.  Parkinson's disease, diagnosed 2014             -continue carbidopa /levodopa  25/100, 2 tablets at 4 AM, 1 at 8am, 1 at noon, and 1 at 4 PM.    -continue carbidopa /levodopa  50/200 at bedtime  -continue pramipexole  0.5 mg 3 times per day.  -refer for home PT -they asked about hospital beds.  I told her insurance will likely not pay.   -discussed importance of exercises  -consider inbrija  in future.    2.  GAD  -pcp managing  -on sertraline, 125 mg daily  -***Was on clonazepam for anxiety, but that has been discontinued by primary.  3.  Chronic low back pain  -On chronic tramadol.  This may be contributing to hallucinations as well.   We will need to watch this closely.  Primary PA is filling this and told her to discuss with prescribing provider.  This was discussed the last few visits.  Gabapentin , 400 mg 3 times per day may be playing a role as well.  I would rather have her address these medications first before I start backing down on the Parkinson's medicines, but certainly backing down on the pramipexole  could be valuable as well.  4.  Apraxia of eyelid openeing  -I didn't see it today but she describes it.  Discussed botox but she decided to hold it for now.  I agree with that.  Subjective:   Jennifer Fletcher was seen today in follow up for Parkinsons disease.  My previous records were reviewed prior to todays visit as well as outside records available to me.   Daughter and son in law supplement hx. no falls since last visit.  No near syncope.  In regards to hallucinations, she states that ***.  She has been taken off of the clonazepam since last visit.  She does remain on chronic tramadol.  Current prescribed movement disorder medications: carbidopa /levodopa  25/100, 2 tablets at 4 AM, 1 at 8am, 1 at noon, and 1 at 4 PM  Carbidopa /levodopa  50/200 CR at bedtime  Pramipexole  0.5 mg at bedtime at 4am/noon/6pm Gabapentin , 400 mg 3 times daily (pcp  RX) Sertraline, 100 mg daily    Prior medications: Carbidopa /levodopa  50/200 (was taking it during the day and we switched her to immediate release); amantadine  (stopped when she was having some hallucinations at a distance)  ALLERGIES:   Allergies  Allergen Reactions   Codeine Nausea And Vomiting    CURRENT MEDICATIONS:  No outpatient medications have been marked as taking for the 02/20/24 encounter (Appointment) with Lucyle Alumbaugh, [place] RAMAN, DO.     Objective:   PHYSICAL EXAMINATION:    VITALS:   There were no vitals filed for this visit.    GEN:  The patient appears stated age and is in NAD. HEENT:  Normocephalic, atraumatic.  The mucous membranes are moist. The superficial temporal arteries are without ropiness or tenderness. CV:  RRR Lungs:  CTAB Neck:  no bruits   Neurological examination:  Orientation: The patient is alert and oriented x3. Cranial nerves: There is good facial symmetry with facial hypomimia. The speech is fluent and clear. Soft palate rises symmetrically and there is no tongue deviation. Hearing is intact to conversational tone. Sensation: Sensation is intact to light touch throughout Motor: Strength is at least antigravity x4.  Movement examination: Tone: There is nl tone in the bilateral upper extremities.  The tone in the lower extremities is nl.  Abnormal movements: there is  mild R leg and head dyskinesia (and she reports its time for medication) Coordination:  There is mild decremation with finger taps bilaterally Gait and Station: The patient pushes off to arise.  She pushes off to arise.   She is given a walker and drags the legs, L>R    I have reviewed and interpreted the following labs independently    Chemistry      Component Value Date/Time   NA 139 05/07/2021 0956   K 3.8 05/07/2021 0956   CL 102 05/07/2021 0956   CO2 26 05/07/2021 0956   BUN 20 05/07/2021 0956   CREATININE 0.83 05/07/2021 0956      Component Value Date/Time    CALCIUM  9.3 05/07/2021 0956   AST 13 08/12/2021 0830   ALT 6 08/12/2021 0830       Lab Results  Component Value Date   WBC 9.1 05/07/2021   HGB 13.6 05/07/2021   HCT 44.2 05/07/2021   MCV 100.5 (H) 05/07/2021   PLT 243 05/07/2021    No results found for: TSH   Total time spent on today's visit was *** minutes, including both face-to-face time and nonface-to-face time.  Time included that spent on review of records (prior notes available to me/labs/imaging if pertinent), discussing treatment and goals, answering patient's questions and coordinating care.  Cc:  Mindi Prentice SQUIBB, PA-C

## 2024-02-20 ENCOUNTER — Ambulatory Visit: Admitting: Neurology

## 2024-02-20 ENCOUNTER — Other Ambulatory Visit

## 2024-02-20 ENCOUNTER — Encounter: Payer: Self-pay | Admitting: Neurology

## 2024-02-20 VITALS — BP 138/92 | HR 103

## 2024-02-20 DIAGNOSIS — H532 Diplopia: Secondary | ICD-10-CM

## 2024-02-20 DIAGNOSIS — H02401 Unspecified ptosis of right eyelid: Secondary | ICD-10-CM | POA: Diagnosis not present

## 2024-02-20 DIAGNOSIS — G7000 Myasthenia gravis without (acute) exacerbation: Secondary | ICD-10-CM

## 2024-02-20 DIAGNOSIS — R1310 Dysphagia, unspecified: Secondary | ICD-10-CM | POA: Diagnosis not present

## 2024-02-20 DIAGNOSIS — G20B2 Parkinson's disease with dyskinesia, with fluctuations: Secondary | ICD-10-CM

## 2024-02-20 MED ORDER — PRAMIPEXOLE DIHYDROCHLORIDE 0.25 MG PO TABS: 0.2500 mg | ORAL_TABLET | Freq: Three times a day (TID) | ORAL | 1 refills | Status: DC

## 2024-02-20 MED ORDER — CARBIDOPA-LEVODOPA 25-100 MG PO TABS: ORAL_TABLET | ORAL | 1 refills | Status: DC

## 2024-02-20 MED ORDER — CARBIDOPA-LEVODOPA ER 50-200 MG PO TBCR: 1.0000 | EXTENDED_RELEASE_TABLET | Freq: Every day | ORAL | 0 refills | Status: DC

## 2024-02-20 NOTE — Patient Instructions (Addendum)
 We are going to start inbrija .  Remember that TWO capsules is ONE dosage (never inhale just one capsule).  You can inhale the capsules as needed up to 5 times per day, separated by 2 hour intervals.  Many patients use this right when the wake up to help with first morning on and then as needed during the day.  You should take a sip of water prior to using the inhaler to avoid side effects.  It may generate some cough right when you use it and that is normal.  There are nurse educators available to help you with this device.  You can call [phone] and they will set you up with a nurse educator to assist you for free of charge.  They are available 8am-8pm Monday-Friday.   Decrease pramipexole  to 025 mg three times per day Increase carbidopa /levodopa  25/100, 2 tablets at 4 AM, 2 at 8am, 2 at noon, and 1 at 4 PM.    Your provider has requested that you have labwork completed today. The lab is located on the Second floor at [address], within the [facility] office. When you get off the elevator, turn right and go in the [facility] [address]; the first brown door on the left.  Tell the ladies behind the desk that you are there for lab work. If you are not called within 15 minutes please check with the front desk.   Once you complete your labs you are free to go. You will receive a call or message via MyChart with your lab results.

## 2024-02-26 LAB — MYASTHENIA GRAVIS PANEL 2
A CHR BINDING ABS: <0.3 nmol/L
ACHR Blocking Abs: <15 %{inhibition} (ref <15)
Acetylchol Modul Ab: 30 %{inhibition}

## 2024-02-27 ENCOUNTER — Ambulatory Visit: Payer: Self-pay | Admitting: Neurology

## 2024-04-14 IMAGING — CT CT CARDIAC CORONARY ARTERY CALCIUM SCORE
3 series · 14 of 20 positions shown, 16 images · non-contrast
Comparison: CT December 18, 2020
COMPARISON: CT December 18, 2020

Addendum:
EXAM:
OVER-READ INTERPRETATION  CT CHEST

The following report is a limited chest CT over-read performed by
09/22/2021. This over-read does not include interpretation of cardiac
or coronary anatomy or pathology. The coronary artery calcium score
interpretation by the cardiologist is attached.
CLINICAL DATA: Cardiovascular Disease Risk stratification
Coronary Calcium Score
TECHNIQUE: A gated, non-contrast computed tomography scan of the heart was
performed using 3mm slice thickness. Axial images were analyzed on a
dedicated workstation. Calcium scoring of the coronary arteries was
performed using the Agatston method.

[Series 2: cascseq 2.0 sa36 70% (id) · axial · 0.39mm/px · z∈[-245,-165]mm · 4 of 68 slices shown]
[im 14/68  vessel]
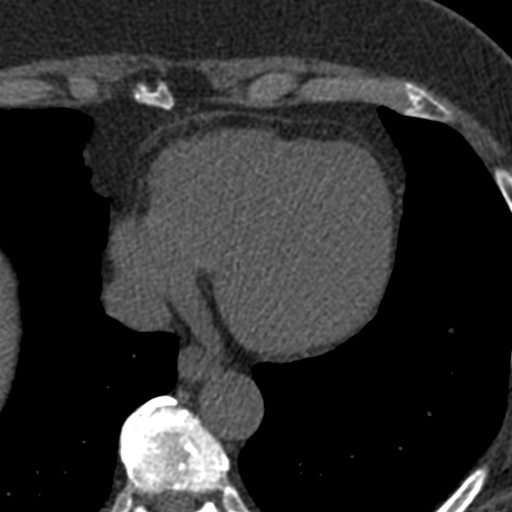
[im 27/68  vessel]
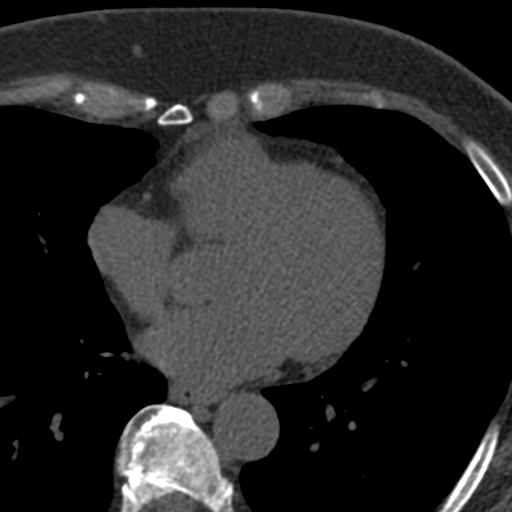
[im 41/68  vessel]
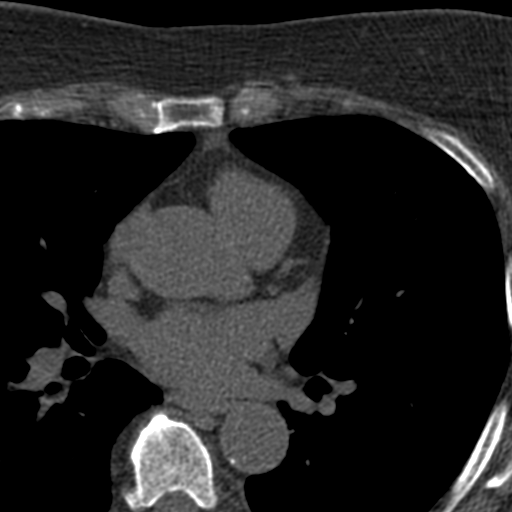
[im 54/68  vessel]
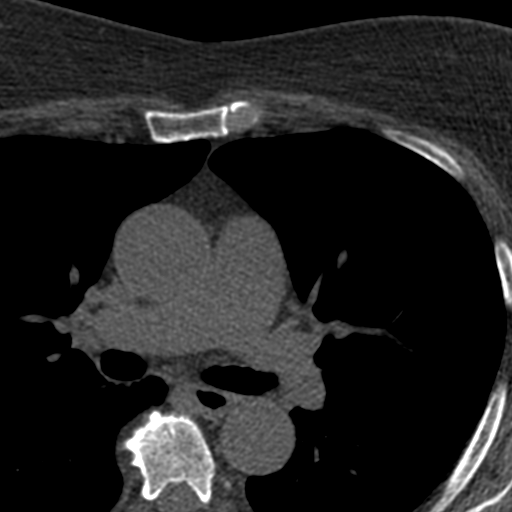

[Series 3: cascseq 2.0 bf37 st · axial · 0.73mm/px · z∈[-249,-161]mm · 5 of 68 slices shown, 7 images]
[im 12/68  vessel]
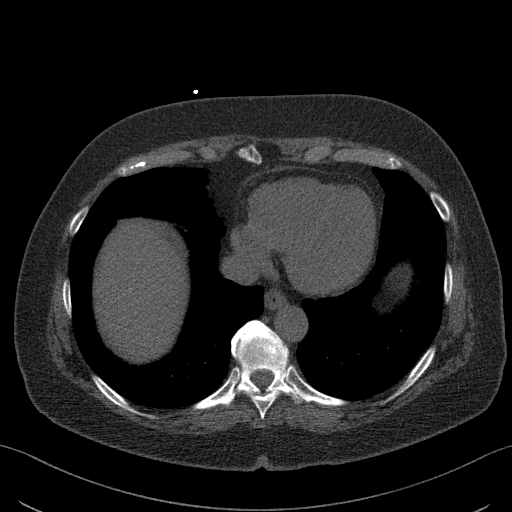
[im 12/68  lung]
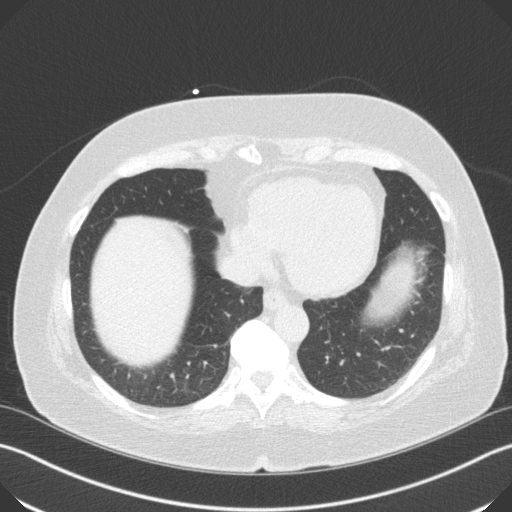
[im 23/68  vessel]
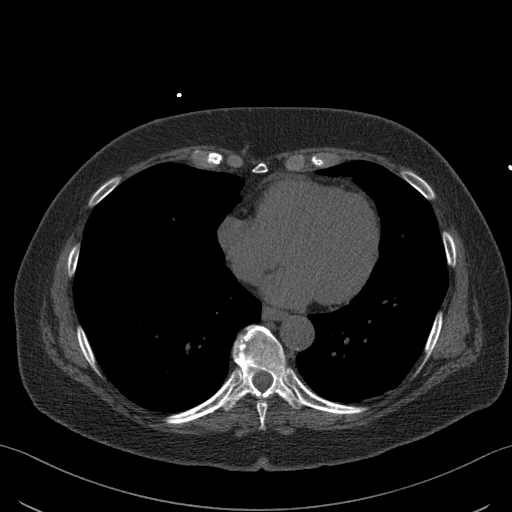
[im 34/68  vessel]
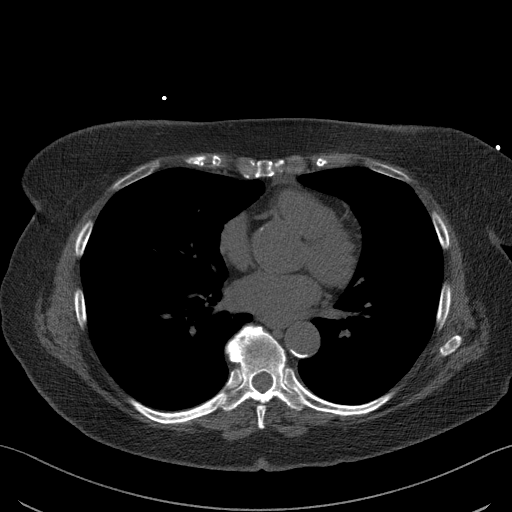
[im 45/68  vessel]
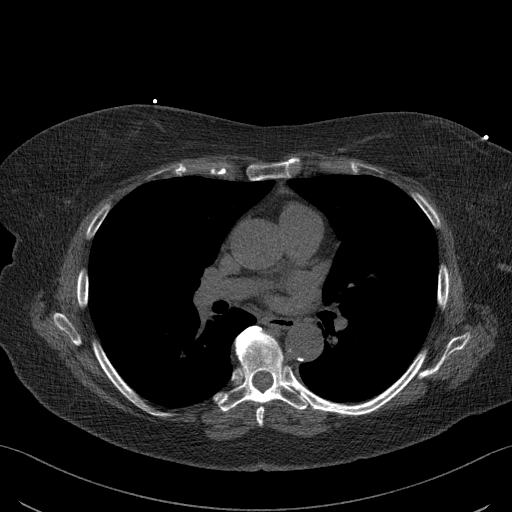
[im 56/68  vessel]
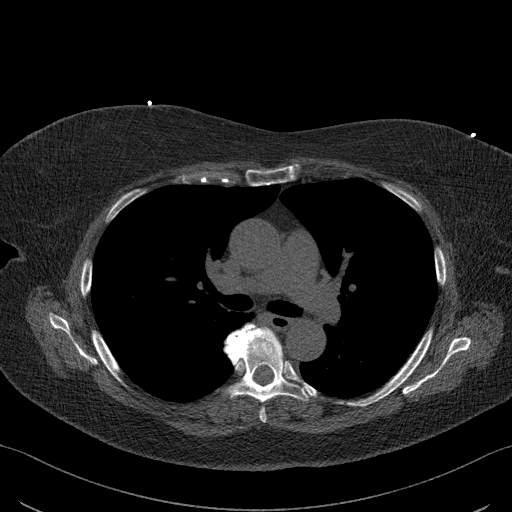
[im 56/68  lung]
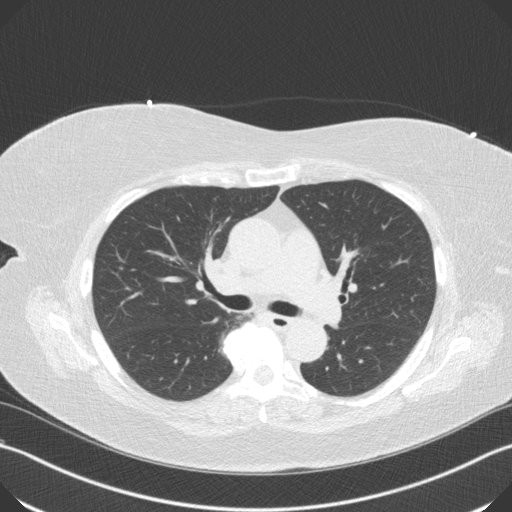

[Series 4: cascseq 2.0 br59 lung · axial · 0.73mm/px · z∈[-249,-161]mm · 5 of 68 slices shown]
[im 12/68  lung]
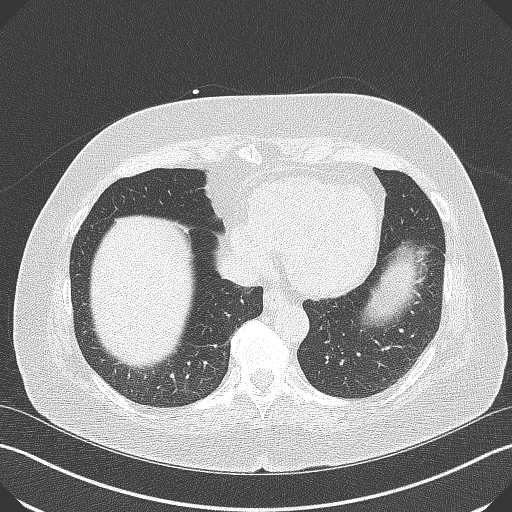
[im 23/68  lung]
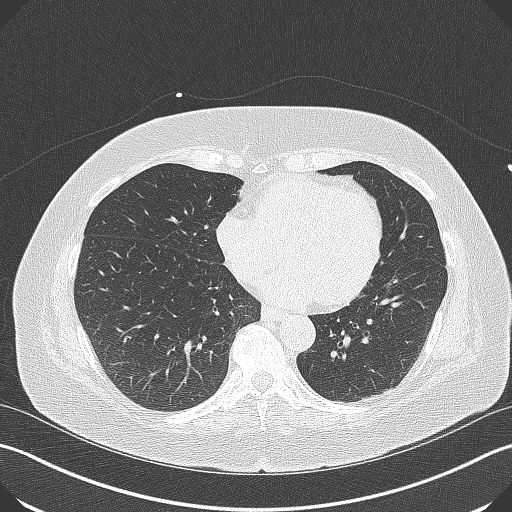
[im 34/68  lung]
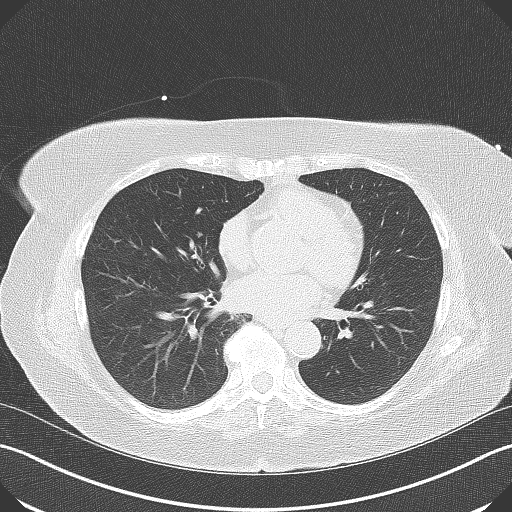
[im 45/68  lung]
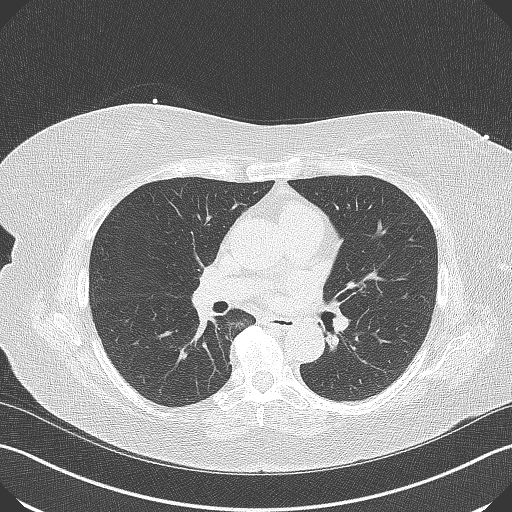
[im 56/68  lung]
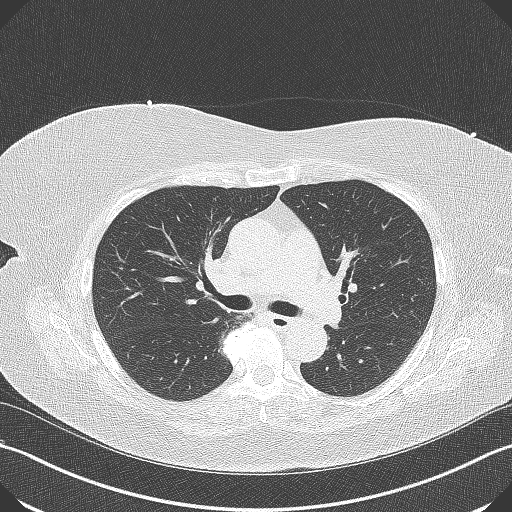

[14 of 20 positions shown; findings below may reference images not displayed]

FINDINGS: Vascular: Aortic atherosclerosis. No acute noncardiac vascular
finding.

Mediastinum/Nodes: No pathologically enlarged mediastinal or hilar
lymph nodes within the acquired field-of-view. Distal esophagus is
grossly unremarkable.

Lungs/Pleura: Osteophyte related scarring in the paramedian right
lower lobe. Within the visualized field of view there are no
suspicious pulmonary nodules or masses, no pleural effusion or
pneumothorax and no focal airspace consolidation.

Upper Abdomen: No acute abnormality.

Musculoskeletal: Thoracic spondylosis.
IMPRESSION: No acute noncardiac finding.

Aortic Atherosclerosis (INSNO-TH1.1).
FINDINGS: Coronary Calcium Score:

Left main: 0

Left anterior descending artery: 0

Left circumflex artery: 0

Right coronary artery: 0

Total: 0

Percentile: 0

Pericardium: Normal.

Ascending Aorta: Upper normal (3.8 cm)

Non-cardiac: See separate report from [REDACTED].
IMPRESSION: Coronary calcium score of 0. This was 0 percentile for age-, race-,
and sex-matched controls.



If CAC=0, it is reasonable to withhold statin therapy and reassess
in 5 to 10 years, as long as higher risk conditions are absent
(diabetes mellitus, family history of premature CHD in first degree
relatives (males <55 years; females <65 years), cigarette smoking,
or LDL >=190 mg/dL).

If CAC is 1 to 99, it is reasonable to initiate statin therapy for
patients >=55 years of age.

If CAC is >=100 or >=75th percentile, it is reasonable to initiate
statin therapy at any age.

Cardiology referral should be considered for patients with CAC
scores >=400 or >=75th percentile.

*0446 AHA/ACC/AACVPR/AAPA/ABC/DAISHIRA/MOOLMAN/KHATUN/[place]/ORVEL/NURIS/GIORGI
Guideline on the Management of Blood Cholesterol: A Report of the
American College of Cardiology/American Heart Association Task Force
on Clinical Practice Guidelines. J Am Coll Cardiol.
6213;73(24):2820-2279.

*** End of Addendum ***
EXAM:
OVER-READ INTERPRETATION  CT CHEST

The following report is a limited chest CT over-read performed by
09/22/2021. This over-read does not include interpretation of cardiac
or coronary anatomy or pathology. The coronary artery calcium score
interpretation by the cardiologist is attached.
FINDINGS: Vascular: Aortic atherosclerosis. No acute noncardiac vascular
finding.

Mediastinum/Nodes: No pathologically enlarged mediastinal or hilar
lymph nodes within the acquired field-of-view. Distal esophagus is
grossly unremarkable.

Lungs/Pleura: Osteophyte related scarring in the paramedian right
lower lobe. Within the visualized field of view there are no
suspicious pulmonary nodules or masses, no pleural effusion or
pneumothorax and no focal airspace consolidation.

Upper Abdomen: No acute abnormality.

Musculoskeletal: Thoracic spondylosis.
IMPRESSION: No acute noncardiac finding.

Aortic Atherosclerosis (INSNO-TH1.1).

## 2024-04-15 NOTE — Progress Notes (Signed)
 "   Assessment/Plan:    1.  Parkinson's disease, diagnosed 2014  -looks markedly improved today             - continue carbidopa /levodopa  25/100, 2 tablets at 4 AM, 2 at 8am, 2 at noon, and 1 at 4 PM.    -continue carbidopa /levodopa  50/200 at bedtime  -decrease pramipexole  0.25 mg twice per day for a week, once per day for a week and then discontinue -use walker at all times  - Continue Inbrija  as needed.  RX written today  - Discussed Vyalev, which may be an option in the future.  She was shown the pump today and last visit.  2.  GAD  -pcp managing  -on sertraline, 125 mg daily  -discussed cymbalta as additional treatment for PN but will discuss with pcp as has potential interaction with the sertraline and same provider should be prescribing both  -Was on clonazepam for anxiety, but that has been discontinued by primary.  3.  Chronic low back pain  -On chronic tramadol.  This may be contributing to hallucinations as well and lengthy discussion that I was backing off on the pramipexole  because of hallucinations and I would like them to address with the PA the tramadol, as I have certainly found that this can cause hallucinations in the Parkinson's population.  Pt has upcoming appt on tues and said she would address with her PA.  Gabapentin  likely may be contributing as well.     4.  R ptosis  -MG labs negative  5.  Probable GERD  -suspect that her feeling of choking when laying down may be GERD as its when she is awake.  She is going to discuss with pcp at appt next week.    Subjective:   Jennifer Fletcher was seen today in follow up for Parkinsons disease.  My previous records were reviewed prior to todays visit as well as outside records available to me.   Son in law supplement hx. patient seen about 8 weeks ago.  Last visit, we increased her levodopa , decreased her pramipexole  and started her on Inbrija  for as needed use.  She reports today that the hallucinations are better and the  inbrija  helped when she has needed it.  I did ask her to talk to her prescribing PA regarding the tramadol, as I have found that this can contribute significantly to hallucinations.  Patient is still on tramadol, last being filled December 11.  She does note that she is going back to the prescribing provider for the back pain next week.  She is also on gabapentin  600 mg twice per day.  I can't remember what that's for.  She does c/o neuropathy.  She has burning paresthesias in the feet and tingling up to the knees.  She is not diabetic.  I feel like I am wearing shoes when I am not.    Having choking sensation as well, mostly when laying down.    Current prescribed movement disorder medications: carbidopa /levodopa  25/100, 2 tablets at 4 AM, 2 at 8am, 2 at noon, and 1 at 4 PM.  (Increased) Carbidopa /levodopa  50/200 CR at bedtime  Inbrija  (started last visit) Pramipexole  0.25 mg 3 times per day (decreased) Gabapentin , 400 mg 3 times daily (pcp RX) Sertraline, 100 mg daily    Prior medications: Carbidopa /levodopa  50/200 (was taking it during the day and we switched her to immediate release); amantadine  (stopped when she was having some hallucinations at a distance)  ALLERGIES:   Allergies  Allergen Reactions   Codeine Nausea And Vomiting    CURRENT MEDICATIONS:  Current Meds  Medication Sig   albuterol  (PROVENTIL ) (2.5 MG/3ML) 0.083% nebulizer solution Take 3 mLs (2.5 mg total) by nebulization every 6 (six) hours as needed for wheezing or shortness of breath.   albuterol  (VENTOLIN  HFA) 108 (90 Base) MCG/ACT inhaler Inhale 2 puffs into the lungs every 6 (six) hours as needed for wheezing or shortness of breath.   aspirin 81 MG chewable tablet Chew 81 mg by mouth daily.   atorvastatin  (LIPITOR) 10 MG tablet Take 1 tablet (10 mg total) by mouth daily.   Azelastine  HCl 0.15 % SOLN Apply 2 sprays each nostril twice a day as needed for nasal drainage or throat clearing (Patient taking  differently: Place 2 sprays into both nostrils 2 (two) times daily as needed (nasal drainage or throat clearing). Apply 2 sprays each nostril twice a day as needed for nasal drainage or throat clearing)   carbidopa -levodopa  (SINEMET  CR) 50-200 MG tablet Take 1 tablet by mouth at bedtime.   carbidopa -levodopa  (SINEMET  IR) 25-100 MG tablet 2 tablets at 4 AM, 2 at 8am, 2 at noon, and 1 at 4 PM.   Cholecalciferol (D3 PO) Take 1 tablet by mouth daily.   clonazePAM (KLONOPIN) 0.5 MG tablet Take 0.5 mg by mouth 2 (two) times daily.   diclofenac (VOLTAREN) 75 MG EC tablet Take 75 mg by mouth 2 (two) times daily.   fexofenadine  (ALLEGRA ) 180 MG tablet Take 1 tablet (180 mg total) by mouth daily.   fluticasone  (FLONASE ) 50 MCG/ACT nasal spray Apply two sprays per nostril daily (AIM FOR EAR ON EACH SIDE) for 1-2 weeks at a time before stopping (Patient taking differently: Place 1 spray into both nostrils daily. Apply two sprays per nostril daily (AIM FOR EAR ON EACH SIDE) for 1-2 weeks at a time before stopping)   furosemide (LASIX) 20 MG tablet Take 20 mg by mouth daily.   gabapentin  (NEURONTIN ) 300 MG capsule Take 1 capsule (300 mg total) by mouth 3 (three) times daily.   levothyroxine (SYNTHROID) 112 MCG tablet Take 150 mcg by mouth daily before breakfast.   montelukast  (SINGULAIR ) 10 MG tablet Take 1 tablet (10 mg total) by mouth at bedtime.   pramipexole  (MIRAPEX ) 0.25 MG tablet Take 1 tablet (0.25 mg total) by mouth 3 (three) times daily.   sertraline (ZOLOFT) 50 MG tablet Take 50 mg by mouth daily.   traMADol (ULTRAM) 50 MG tablet Take 50 mg by mouth every 6 (six) hours as needed.     Objective:   PHYSICAL EXAMINATION:    VITALS:   Vitals:   04/23/24 1017  BP: 132/86  Pulse: 71  SpO2: 98%  Weight: 240 lb 6.4 oz (109 kg)       GEN:  The patient appears stated age and is in NAD. HEENT:  Normocephalic, atraumatic.  The mucous membranes are moist. The superficial temporal arteries are  without ropiness or tenderness. CV: Tachycardic.  Regular. Lungs:  CTAB Neck:  no bruits   Neurological examination:  Orientation: The patient is alert and oriented x3. Cranial nerves: During part of the visit, the R eye is completely closed and then it opens back up and there is good facial symmetry with facial hypomimia. The speech is fluent and clear. Soft palate rises symmetrically and there is no tongue deviation. Hearing is intact to conversational tone. Sensation: Sensation is intact to light touch throughout Motor: Strength is at least antigravity x4.  Movement examination: Tone: There is nl tone in the bilateral upper extremities.  The tone in the R lower extremity is good but is mild to mod increased in the LLE Abnormal movements: there is mild dyskinesia today Coordination:  There is mod decremation bilaterally Gait and Station: The patient pushes off to arise.  She arises without help (last time she was 2 person assist).  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     I have reviewed and interpreted the following labs independently    Chemistry      Component Value Date/Time   NA 139 05/07/2021 0956   K 3.8 05/07/2021 0956   CL 102 05/07/2021 0956   CO2 26 05/07/2021 0956   BUN 20 05/07/2021 0956   CREATININE 0.83 05/07/2021 0956      Component Value Date/Time   CALCIUM  9.3 05/07/2021 0956   AST 13 08/12/2021 0830   ALT 6 08/12/2021 0830       Lab Results  Component Value Date   WBC 9.1 05/07/2021   HGB 13.6 05/07/2021   HCT 44.2 05/07/2021   MCV 100.5 (H) 05/07/2021   PLT 243 05/07/2021    No results found for: TSH   Total time spent on today's visit was 40 minutes, including both face-to-face time and nonface-to-face time.  Time included that spent on review of records (prior notes available to me/labs/imaging if pertinent), discussing treatment and goals, answering patient's questions and coordinating care.  Cc:   Brien Butler SAUNDERS, PA-C "

## 2024-04-23 ENCOUNTER — Ambulatory Visit: Admitting: Neurology

## 2024-04-23 VITALS — BP 132/86 | HR 71 | Wt 240.4 lb

## 2024-04-23 DIAGNOSIS — G20B2 Parkinson's disease with dyskinesia, with fluctuations: Secondary | ICD-10-CM | POA: Diagnosis not present

## 2024-04-23 DIAGNOSIS — G609 Hereditary and idiopathic neuropathy, unspecified: Secondary | ICD-10-CM

## 2024-04-23 DIAGNOSIS — K219 Gastro-esophageal reflux disease without esophagitis: Secondary | ICD-10-CM | POA: Diagnosis not present

## 2024-04-23 MED ORDER — INBRIJA 42 MG IN CAPS: ORAL_CAPSULE | RESPIRATORY_TRACT | 1 refills | Status: DC

## 2024-04-23 MED ORDER — CARBIDOPA-LEVODOPA 25-100 MG PO TABS: ORAL_TABLET | ORAL | 1 refills | Status: AC

## 2024-04-23 MED ORDER — CARBIDOPA-LEVODOPA ER 50-200 MG PO TBCR: 1.0000 | EXTENDED_RELEASE_TABLET | Freq: Every day | ORAL | 1 refills | Status: AC

## 2024-04-23 NOTE — Patient Instructions (Addendum)
 Discuss with Brien Butler SAUNDERS, PA-C whether or not the addition of cymbalta could be added (either with or instead of sertraline) to help with mood and neuropathy.    Decrease pramipexole  twice a day for a week, then once a day for a week and then stop it  Discuss the tramadol with Wright, Krystal R, PA-C as it can contribute to hallucinations

## 2024-04-24 ENCOUNTER — Encounter: Payer: Self-pay | Admitting: Neurology

## 2024-04-24 ENCOUNTER — Other Ambulatory Visit: Payer: Self-pay

## 2024-04-24 DIAGNOSIS — G20B2 Parkinson's disease with dyskinesia, with fluctuations: Secondary | ICD-10-CM

## 2024-04-24 MED ORDER — INBRIJA 42 MG IN CAPS: ORAL_CAPSULE | RESPIRATORY_TRACT | 1 refills | Status: DC

## 2024-04-24 NOTE — Telephone Encounter (Signed)
 It is [facility] just a few letters difference I fixed it and resent

## 2024-04-29 ENCOUNTER — Other Ambulatory Visit: Payer: Self-pay

## 2024-04-29 ENCOUNTER — Telehealth: Payer: Self-pay | Admitting: Neurology

## 2024-04-29 DIAGNOSIS — G20B2 Parkinson's disease with dyskinesia, with fluctuations: Secondary | ICD-10-CM

## 2024-04-29 MED ORDER — INBRIJA 42 MG IN CAPS: ORAL_CAPSULE | RESPIRATORY_TRACT | Status: AC

## 2024-04-29 MED ORDER — INBRIJA 42 MG IN CAPS: ORAL_CAPSULE | RESPIRATORY_TRACT | 1 refills | Status: DC

## 2024-04-29 NOTE — Telephone Encounter (Signed)
 Lourissa from [facility] called and LM with AN. States she needs to clarify the prescription that was sent for this pt. It does not show the amount to take daily  Usual dose is 2 caps INH, Inbrejia 42mg  up to 5x a day.

## 2024-04-29 NOTE — Telephone Encounter (Signed)
 Kashmire - the pharmacy tech with [facility]  called and she stated  that they need the correct doses for the prescription called Inbrijia . Verneita stated that they receive the doses today from our office, but they were still incorrect. Please call them for clarification.

## 2024-04-29 NOTE — Telephone Encounter (Signed)
 Resent Inbrijia with directions

## 2024-04-29 NOTE — Telephone Encounter (Signed)
 Called and verified RX for inbrijia

## 2024-10-21 ENCOUNTER — Ambulatory Visit: Payer: Self-pay | Admitting: Neurology
